# Patient Record
Sex: Female | Born: 1975
Health system: Southern US, Community
[De-identification: ages and names within clinical notes are randomized; demographics above are authoritative.]

## PROBLEM LIST (undated history)

## (undated) DIAGNOSIS — E039 Hypothyroidism, unspecified: Secondary | ICD-10-CM

## (undated) DIAGNOSIS — E785 Hyperlipidemia, unspecified: Secondary | ICD-10-CM

## (undated) DIAGNOSIS — E119 Type 2 diabetes mellitus without complications: Secondary | ICD-10-CM

---

## 2007-02-06 HISTORY — PX: CHOLECYSTECTOMY: SHX55

## 2010-12-07 HISTORY — PX: TONSILECTOMY/ADENOIDECTOMY WITH MYRINGOTOMY: SHX6125

## 2012-01-23 ENCOUNTER — Encounter: Payer: Self-pay | Admitting: Family Medicine

## 2012-01-23 ENCOUNTER — Ambulatory Visit (INDEPENDENT_AMBULATORY_CARE_PROVIDER_SITE_OTHER): Payer: Self-pay | Admitting: Family Medicine

## 2012-01-23 VITALS — BP 137/88 | HR 88 | Temp 98.6°F | Ht 67.0 in | Wt 289.0 lb

## 2012-01-23 DIAGNOSIS — E669 Obesity, unspecified: Secondary | ICD-10-CM

## 2012-01-23 DIAGNOSIS — Z0000 Encounter for general adult medical examination without abnormal findings: Secondary | ICD-10-CM

## 2012-01-23 NOTE — Assessment & Plan Note (Signed)
A: well. Normal exam. BP elevated but not hypertensive. P: Release of med record form filled out. Last pap 12/27/11 wnl. Also had STD testing and other blood work.  Once you have insurance f/u for repeat fasting cholesterol test.  F/u yearly for physicals. F/u in 3 years for pap smear.

## 2012-01-23 NOTE — Progress Notes (Signed)
Subjective:     Patient ID: Tina Boone, female   DOB: 1975/06/14, 36 y.o.   MRN: [id number]  HPI  35 yo F new patient presents for physical and to establish primary care.   1. Obesity: eat well. Water main beverage. Fruits and vegetables incorporated in most meals. Does not exercise. Children active and fit. Has a dog (Husky) that just had puppies.   History reviewed. No pertinent past medical history.  Past Surgical History  Procedure Date  . Cholecystectomy 2009    History   Social History Narrative   Lives with 60 yo daughter and 72 yo son.    Review of Systems Patient Information Form: Screening and ROS  AUDIT-C Score: 0 Do you feel safe in relationships? yes PHQ-2:negative  Review of Symptoms  General:  Negative for nexplained weight loss, fever Skin: Negative for new or changing mole, sore that won't heal HEENT: Negative for trouble hearing, trouble seeing, ringing in ears, mouth sores, hoarseness, change in voice, dysphagia. CV:  Negative for chest pain, dyspnea, edema, palpitations Resp: Negative for cough, dyspnea, hemoptysis GI: Negative for nausea, vomiting, diarrhea, constipation, abdominal pain, melena, hematochezia. GU: Negative for dysuria, incontinence, urinary hesitance, hematuria, vaginal or penile discharge, polyuria, sexual difficulty, lumps in testicle or breasts MSK: Negative for muscle cramps or aches, joint pain or swelling Neuro: Negative for headaches, weakness, numbness, dizziness, passing out/fainting Psych: Negative for depression, anxiety, memory problems    Objective:   Physical Exam BP 137/88  Pulse 88  Temp 98.6 F (37 C) (Oral)  Ht 5\' 7"  (1.702 m)  Wt 289 lb (131.09 kg)  BMI 45.26 kg/m2  LMP 12/23/2011 General appearance: alert, cooperative and no distress Head: Normocephalic, without obvious abnormality, atraumatic Eyes: conjunctivae/corneas clear. PERRL, EOM's intact.  Neck: no adenopathy, no carotid bruit, no JVD,  supple, symmetrical, trachea midline and thyroid not enlarged, symmetric, no tenderness/mass/nodules Lungs: clear to auscultation bilaterally Heart: regular rate and rhythm, S1, S2 normal, no murmur, click, rub or gallop Abdomen: soft, non-tender; bowel sounds normal; no masses,  no organomegaly and obese Extremities: extremities normal, atraumatic, no cyanosis or edema Pulses: 2+ and symmetric Neurologic: Grossly normal     Assessment and Plan:

## 2012-01-23 NOTE — Assessment & Plan Note (Signed)
Regarding weight loss, here is a basic  plan that I find helpful.   For your diet:  1. Make sure to eat breakfast, lunch and dinner (also may add one snack mid morning or mid afternoon).  2. Carbs: no more than 2 servings (30 gram/2oz) per meal and 1 serving per snack.  3. Exercise such that you sweat some and your heart rate goes up most days of the week.  4. Water, water, water   Keep in mind that there is a Artist in the office, Wyona Almas.

## 2012-01-23 NOTE — Patient Instructions (Addendum)
Ms. Fenner,   Thank you for coming in today.  It was very nice to meet you.   Regarding weight loss, here is a basic  plan that I find helpful.   For your diet:  1. Make sure to eat breakfast, lunch and dinner (also may add one snack mid morning or mid afternoon).  2. Carbs: no more than 2 servings (30 gram/2oz) per meal and 1 serving per snack.  3. Exercise such that you sweat some and your heart rate goes up most days of the week.  4. Water, water, water   Keep in mind that there is a Artist in the office, Wyona Almas.   Once you have insurance f/u for repeat fasting cholesterol test.  F/u yearly for physicals. F/u in 3 years for pap smear.   Dr. Armen Pickup

## 2013-03-06 ENCOUNTER — Encounter: Payer: Self-pay | Admitting: Physician Assistant

## 2013-03-06 ENCOUNTER — Ambulatory Visit (INDEPENDENT_AMBULATORY_CARE_PROVIDER_SITE_OTHER): Payer: 59 | Admitting: Physician Assistant

## 2013-03-06 ENCOUNTER — Ambulatory Visit: Payer: Self-pay | Admitting: Family Medicine

## 2013-03-06 VITALS — BP 145/86 | HR 82 | Wt 298.0 lb

## 2013-03-06 DIAGNOSIS — Z131 Encounter for screening for diabetes mellitus: Secondary | ICD-10-CM

## 2013-03-06 DIAGNOSIS — IMO0001 Reserved for inherently not codable concepts without codable children: Secondary | ICD-10-CM | POA: Insufficient documentation

## 2013-03-06 DIAGNOSIS — Z13220 Encounter for screening for lipoid disorders: Secondary | ICD-10-CM

## 2013-03-06 DIAGNOSIS — Z6841 Body Mass Index (BMI) 40.0 and over, adult: Secondary | ICD-10-CM

## 2013-03-06 DIAGNOSIS — Z309 Encounter for contraceptive management, unspecified: Secondary | ICD-10-CM

## 2013-03-06 DIAGNOSIS — R030 Elevated blood-pressure reading, without diagnosis of hypertension: Secondary | ICD-10-CM

## 2013-03-06 DIAGNOSIS — Z23 Encounter for immunization: Secondary | ICD-10-CM

## 2013-03-06 LAB — COMPLETE METABOLIC PANEL WITH GFR
ALBUMIN: 4.4 g/dL (ref 3.5–5.2)
ALK PHOS: 74 U/L (ref 39–117)
ALT: 19 U/L (ref 0–35)
AST: 13 U/L (ref 0–37)
BUN: 11 mg/dL (ref 6–23)
CALCIUM: 9.3 mg/dL (ref 8.4–10.5)
CO2: 25 mEq/L (ref 19–32)
CREATININE: 0.64 mg/dL (ref 0.50–1.10)
Chloride: 108 mEq/L (ref 96–112)
GFR, Est African American: >89 mL/min
GLUCOSE: 94 mg/dL (ref 70–99)
POTASSIUM: 4.8 meq/L (ref 3.5–5.3)
Sodium: 142 mEq/L (ref 135–145)
Total Bilirubin: 0.4 mg/dL (ref 0.2–1.2)
Total Protein: 7.3 g/dL (ref 6.0–8.3)

## 2013-03-06 LAB — LIPID PANEL
CHOL/HDL RATIO: 5.1 ratio
CHOLESTEROL: 174 mg/dL (ref 0–200)
HDL: 34 mg/dL — ABNORMAL LOW (ref >39)
LDL Cholesterol: 117 mg/dL — ABNORMAL HIGH (ref 0–99)
TRIGLYCERIDES: 117 mg/dL (ref <150)
VLDL: 23 mg/dL (ref 0–40)

## 2013-03-06 LAB — TSH: TSH: 6.162 u[IU]/mL — AB (ref 0.350–4.500)

## 2013-03-06 MED ORDER — MEDROXYPROGESTERONE ACETATE 150 MG/ML IM SUSP
150.0000 mg | Freq: Once | INTRAMUSCULAR | Status: AC
  Administered 2013-03-06: 150 mg via INTRAMUSCULAR

## 2013-03-06 NOTE — Progress Notes (Signed)
   Subjective:    Patient ID: Tina Boone, female    DOB: 11/07/1975, 38 y.o.   MRN: [id number]  HPI Patient is a 38 year old white female who presents to the clinic to establish care. She's having to change to a new doctor due to insurance reasons. Patient is currently on Depo-Medrol for pregnancy prevention as well as menstrual period cessation. Patient has not had her regular doctor in many years. She says it has been at least 2 years since cholesterol has been checked. She is on no current medications daily right now. She does have a history of slight blood pressure elevation. She denies any shortness of breath, chest pains, palpitations, vision changes or headaches. She does walk 30 minutes one to 2 times a week. She denies any smoking.  . Family History  Problem Relation Age of Onset  . Heart disease Father   . Stroke Brother   . Cancer Paternal Aunt     leukemia  . Cancer Paternal Grandmother [phone]    breast  . Stroke Paternal Grandfather       Review of Systems     Objective:   Physical Exam  Constitutional: She is oriented to person, place, and time. She appears well-developed and well-nourished.  Obesity.  HENT:  Head: Normocephalic and atraumatic.  Neck: Normal range of motion. Neck supple. No thyromegaly present.  Cardiovascular: Normal rate, regular rhythm and normal heart sounds.   Pulmonary/Chest: Effort normal and breath sounds normal. She has no wheezes.  Lymphadenopathy:    She has no cervical adenopathy.  Neurological: She is alert and oriented to person, place, and time.  Skin: Skin is dry.  Psychiatric: She has a normal mood and affect. Her behavior is normal.          Assessment & Plan:   obesity-discussion of help to nutrition was had today. Patient reports that she generally eats a lot of fruit and vegetables and decreases her fried foods. She has never had her thyroid checked. She admits she does not exercise daily foot at least once or twice a  week she walks for 30 minutes. She's never tried any medication to help her lose weight. Discussed with patient we continued to follow weight. I do think if she can lose weight it would decrease the possibility of future health problems. Offered nutritionist today but patient declined.  Elevated blood pressure-at this point we will continue to monitor blood pressure. Patient is obese and the like if she could lose 20 pounds her blood pressure would likely decrease as well. Patient was also counseled on low salt diet. Will followup in 2-3 months for recheck.  Contraception- depo provera given in office without complication. Screening labs were ordered today.     tdap given if office today without complication.

## 2013-03-06 NOTE — Patient Instructions (Signed)

## 2013-03-09 ENCOUNTER — Other Ambulatory Visit: Payer: Self-pay | Admitting: Physician Assistant

## 2013-03-09 MED ORDER — LEVOTHYROXINE SODIUM 50 MCG PO TABS: 50.0000 ug | ORAL_TABLET | Freq: Every day | ORAL | Status: DC

## 2013-03-22 ENCOUNTER — Encounter: Payer: Self-pay | Admitting: Emergency Medicine

## 2013-03-22 ENCOUNTER — Emergency Department (INDEPENDENT_AMBULATORY_CARE_PROVIDER_SITE_OTHER)
Admission: EM | Admit: 2013-03-22 | Discharge: 2013-03-22 | Disposition: A | Discharge status: Home / Self Care | Payer: 59 | Source: Home / Self Care | Attending: Emergency Medicine | Admitting: Emergency Medicine

## 2013-03-22 DIAGNOSIS — J029 Acute pharyngitis, unspecified: Secondary | ICD-10-CM

## 2013-03-22 DIAGNOSIS — J069 Acute upper respiratory infection, unspecified: Secondary | ICD-10-CM

## 2013-03-22 LAB — POCT RAPID STREP A (OFFICE): RAPID STREP A SCREEN: NEGATIVE

## 2013-03-22 MED ORDER — AMOXICILLIN-POT CLAVULANATE 875-125 MG PO TABS: 1.0000 | ORAL_TABLET | Freq: Two times a day (BID) | ORAL | Status: DC

## 2013-03-22 MED ORDER — GUAIFENESIN-CODEINE 100-10 MG/5ML PO SOLN: 5.0000 mL | Freq: Four times a day (QID) | ORAL | Status: DC | PRN

## 2013-03-22 NOTE — ED Provider Notes (Signed)
CSN: [id number]     Arrival date & time 03/22/13  1345 History   First MD Initiated Contact with Patient 03/22/13 1540     Chief Complaint  Patient presents with  . Fever  . Nasal Congestion  . Generalized Body Aches  . Cough     (Consider location/radiation/quality/duration/timing/severity/associated sxs/prior Treatment) HPI Tina Boone is a 38 y.o. female who complains of onset of cold symptoms for a few days, but had been sick for about 2 weeks and was getting better up until a few days ago.  The symptoms are constant and mild-moderate in severity.  Has a history of strep throat, despite having a tonsillectomy ~2 years ago. + sore throat + hoarseness + cough No pleuritic pain No wheezing + nasal congestion + post-nasal drainage + sinus pain/pressure No chest congestion No itchy/red eyes No earache No hemoptysis No SOB No chills/sweats No fever No nausea No vomiting No abdominal pain No diarrhea No skin rashes + fatigue + myalgias No headache     History reviewed. No pertinent past medical history. Past Surgical History  Procedure Laterality Date  . Cholecystectomy  2009  . Tonsilectomy/adenoidectomy with myringotomy  12/2010   Family History  Problem Relation Age of Onset  . Heart disease Father   . Stroke Brother   . Cancer Paternal Aunt     leukemia  . Cancer Paternal Grandmother [phone]    breast  . Stroke Paternal Grandfather    History  Substance Use Topics  . Smoking status: Never Smoker   . Smokeless tobacco: Never Used  . Alcohol Use: No   OB History   Grav Para Term Preterm Abortions TAB SAB Ect Mult Living                 Review of Systems  All other systems reviewed and are negative.      Allergies  Review of patient's allergies indicates no known allergies.  Home Medications   Current Outpatient Rx  Name  Route  Sig  Dispense  Refill  . amoxicillin-clavulanate (AUGMENTIN) 875-125 MG per tablet   Oral   Take 1 tablet by mouth 2  (two) times daily.   16 tablet   0   . guaiFENesin-codeine 100-10 MG/5ML syrup   Oral   Take 5 mLs by mouth every 6 (six) hours as needed for cough.   120 mL   0   . levothyroxine (SYNTHROID, LEVOTHROID) 50 MCG tablet   Oral   Take 1 tablet (50 mcg total) by mouth daily.   30 tablet   1   . medroxyPROGESTERone (DEPO-PROVERA) 150 MG/ML injection   Intramuscular   Inject 150 mg into the muscle every 3 (three) months.          BP 140/84  Pulse 109  Temp(Src) 98.1 F (36.7 C) (Oral)  Resp 18  Ht 5\' 7"  (1.702 m)  Wt 298 lb (135.172 kg)  BMI 46.66 kg/m2  SpO2 97% Physical Exam  Nursing note and vitals reviewed. Constitutional: She is oriented to person, place, and time. She appears well-developed and well-nourished.  HENT:  Head: Normocephalic and atraumatic.  Right Ear: Tympanic membrane, external ear and ear canal normal.  Left Ear: Tympanic membrane, external ear and ear canal normal.  Nose: Mucosal edema and rhinorrhea present.  Mouth/Throat: Posterior oropharyngeal erythema present. No oropharyngeal exudate or posterior oropharyngeal edema.  Eyes: No scleral icterus.  Neck: Neck supple.  Cardiovascular: Regular rhythm and normal heart sounds.   Pulmonary/Chest: Effort normal  and breath sounds normal. No respiratory distress.  Neurological: She is alert and oriented to person, place, and time.  Skin: Skin is warm and dry.  Psychiatric: She has a normal mood and affect. Her speech is normal.    ED Course  Procedures (including critical care time) Labs Review Labs Reviewed - No data to display Imaging Review No results found.    MDM   Final diagnoses:  Acute upper respiratory infections of unspecified site  Acute pharyngitis    1)  Take the prescribed antibiotic as instructed.  Rapid strep negative.  No culture done. 2)  Use nasal saline solution (over the counter) at least 3 times a day. 3)  Use over the counter decongestants like Zyrtec-D every 12  hours as needed to help with congestion.  If you have hypertension, do not take medicines with sudafed.  4)  Can take tylenol every 6 hours or motrin every 8 hours for pain or fever. 5)  Follow up with your primary doctor if no improvement in 5-7 days, sooner if increasing pain, fever, or new symptoms.     Marlaine Hind, MD 03/22/13 [phone]

## 2013-03-22 NOTE — ED Notes (Signed)
History of URI in January 2015 with intermittent improvement; 3 days ago symptoms worsened with fever, cough, congestion, hoarseness, body aches. Took cold/flu med OTC 2 hours before arrival here.

## 2013-04-01 ENCOUNTER — Ambulatory Visit (INDEPENDENT_AMBULATORY_CARE_PROVIDER_SITE_OTHER): Payer: 59 | Admitting: Physician Assistant

## 2013-04-01 ENCOUNTER — Encounter: Payer: Self-pay | Admitting: Physician Assistant

## 2013-04-01 VITALS — BP 141/89 | HR 82 | Wt 294.0 lb

## 2013-04-01 DIAGNOSIS — I10 Essential (primary) hypertension: Secondary | ICD-10-CM | POA: Insufficient documentation

## 2013-04-01 DIAGNOSIS — E039 Hypothyroidism, unspecified: Secondary | ICD-10-CM

## 2013-04-01 DIAGNOSIS — Z0000 Encounter for general adult medical examination without abnormal findings: Secondary | ICD-10-CM

## 2013-04-01 NOTE — Patient Instructions (Signed)

## 2013-04-03 DIAGNOSIS — E039 Hypothyroidism, unspecified: Secondary | ICD-10-CM | POA: Insufficient documentation

## 2013-04-03 NOTE — Progress Notes (Signed)
Subjective:     Tina Boone is a 38 y.o. female and is here for a comprehensive physical exam. The patient reports no problems.  History   Social History  . Marital Status: Single    Spouse Name: N/A    Number of Children: 2  . Years of Education: some colle   Occupational History  . [employer]      hard copies sheet music full time    Social History Main Topics  . Smoking status: Never Smoker   . Smokeless tobacco: Never Used  . Alcohol Use: No  . Drug Use: No  . Sexual Activity: Not Currently    Birth Control/ Protection: Injection     Comment: Depo   Other Topics Concern  . Not on file   Social History Narrative   Lives with 38 yo daughter and 38 yo son.    Health Maintenance  Topic Date Due  . Influenza Vaccine  09/03/2013 (Originally 09/05/2012)  . Pap Smear  12/07/2014  . Tetanus/tdap  03/07/2023    The following portions of the patient's history were reviewed and updated as appropriate: allergies, current medications, past family history, past medical history, past social history, past surgical history and problem list.  Review of Systems A comprehensive review of systems was negative.   Objective:    BP 141/89  Pulse 82  Wt 294 lb (133.358 kg) General appearance: alert, cooperative, appears stated age and morbidly obese Head: Normocephalic, without obvious abnormality, atraumatic Eyes: conjunctivae/corneas clear. PERRL, EOM's intact. Fundi benign. Ears: normal TM's and external ear canals both ears Nose: Nares normal. Septum midline. Mucosa normal. No drainage or sinus tenderness. Throat: lips, mucosa, and tongue normal; teeth and gums normal and tonsils removed. Neck: no adenopathy, no carotid bruit, no JVD, supple, symmetrical, trachea midline and thyroid not enlarged, symmetric, no tenderness/mass/nodules Back: symmetric, no curvature. ROM normal. No CVA tenderness. Lungs: clear to auscultation bilaterally Breasts: normal  appearance, no masses or tenderness Heart: regular rate and rhythm, S1, S2 normal, no murmur, click, rub or gallop Abdomen: soft, non-tender; bowel sounds normal; no masses,  no organomegaly Extremities: extremities normal, atraumatic, no cyanosis or edema Pulses: 2+ and symmetric Skin: Skin color, texture, turgor normal. No rashes or lesions Lymph nodes: Cervical, supraclavicular, and axillary nodes normal. Neurologic: Grossly normal    Assessment:    Healthy female exam.     Plan:    CPE- TDap  today. Last 12/2011. Not of age for screening mammograms. Discuss weight with patient. Patient registers in the morbid obesity category. Discuss long-term weight loss management pills such as Belviq, Contrave, qysmia. Reminded patient to we get blood pressure down she is not a candidate for phentermine. Patient does not want to start any weight loss medications at this time. Patient does not think that motivated to lose weight. Would like to see patient increase her exercise to 150 minutes a week. LDL was 117. Fasting glucose was normal.  Hypertension-patient declines medication today. I'm okay with that as long as we've recheck in 3 months and if still elevated then we discussed medication therapy. Encouraged low salt diet.  I had a brief discussion with patient about the probability of sleep apnea due to her weight and now HTN. This could be increasing her blood pressure and causing other fatigue problems. Patient is not interested in having sleep apnea study at this time. She does admit to being a mouth breather. Will continue to have this discussion with her as we  monitor her blood pressure.  Hypothyroidism-we'll check labs thyroid was elevated. Sent low-dose Synthroid 50 MCG use daily. Patient has not started taking regularly at this time. I did give patient lab slips to have checked once been on medication for at least 6 weeks. See After Visit Summary for Counseling Recommendations

## 2013-05-07 ENCOUNTER — Other Ambulatory Visit: Payer: Self-pay | Admitting: Physician Assistant

## 2013-05-25 ENCOUNTER — Encounter: Payer: Self-pay | Admitting: *Deleted

## 2013-05-25 ENCOUNTER — Ambulatory Visit (INDEPENDENT_AMBULATORY_CARE_PROVIDER_SITE_OTHER): Payer: 59 | Admitting: Physician Assistant

## 2013-05-25 VITALS — BP 133/76 | HR 82 | Ht 67.0 in | Wt 298.0 lb

## 2013-05-25 DIAGNOSIS — Z309 Encounter for contraceptive management, unspecified: Secondary | ICD-10-CM

## 2013-05-25 MED ORDER — MEDROXYPROGESTERONE ACETATE 150 MG/ML IM SUSP
150.0000 mg | Freq: Once | INTRAMUSCULAR | Status: AC
  Administered 2013-05-25: 150 mg via INTRAMUSCULAR

## 2013-05-25 NOTE — Progress Notes (Signed)
   Subjective:    Patient ID: Tina Boone, female    DOB: 02/19/1975, 38 y.o.   MRN: [id number] Depo Provera given IM today.  Left deltoid.  No complications.  Pt due for next injection July 7-20. Hutchinson Isenberg,CMA HPI    Review of Systems     Objective:   Physical Exam        Assessment & Plan:

## 2013-05-25 NOTE — Progress Notes (Signed)
   Subjective:    Patient ID: Tina Boone, female    DOB: Jul 07, 1975, 38 y.o.   MRN: [id number]  HPI    Review of Systems     Objective:   Physical Exam        Assessment & Plan:

## 2013-07-01 ENCOUNTER — Encounter: Payer: Self-pay | Admitting: Physician Assistant

## 2013-07-01 ENCOUNTER — Ambulatory Visit (INDEPENDENT_AMBULATORY_CARE_PROVIDER_SITE_OTHER): Payer: 59 | Admitting: Physician Assistant

## 2013-07-01 VITALS — BP 139/70 | HR 95 | Ht 67.0 in | Wt 297.0 lb

## 2013-07-01 DIAGNOSIS — E039 Hypothyroidism, unspecified: Secondary | ICD-10-CM

## 2013-07-01 DIAGNOSIS — Z6841 Body Mass Index (BMI) 40.0 and over, adult: Secondary | ICD-10-CM

## 2013-07-01 DIAGNOSIS — I10 Essential (primary) hypertension: Secondary | ICD-10-CM

## 2013-07-01 LAB — T4, FREE: FREE T4: 1.18 ng/dL (ref 0.80–1.80)

## 2013-07-01 LAB — TSH: TSH: 4.761 u[IU]/mL — ABNORMAL HIGH (ref 0.350–4.500)

## 2013-07-01 NOTE — Progress Notes (Signed)
   Subjective:    Patient ID: Tina Boone, female    DOB: [birth year], 38 y.o.   MRN: [id number]  HPI Pt presents to the clinic to follow up on hypothyroidism and elevated blood pressure.   Hypothyroidism-we recently started her on thyroid medication for elevated TSH. She is doing well and has no complaints. She has not noticed any tremendous difference in her mood or energy level. She comes in for recheck of labs.  Hypertension-patient has been borderline hypertensive since last physical. She has been taking her blood pressures at home and they've been running in the low 130s. She denies any chest pains, palpitations, headaches or vision changes. She admits she has not been doing anything to lose weight.   Review of Systems  All other systems reviewed and are negative.      Objective:   Physical Exam  Constitutional: She is oriented to person, place, and time. She appears well-developed and well-nourished.  HENT:  Head: Normocephalic and atraumatic.  Neck: Normal range of motion. Neck supple. No thyromegaly present.  Cardiovascular: Normal rate, regular rhythm and normal heart sounds.   Pulmonary/Chest: Effort normal and breath sounds normal. She has no wheezes.  Neurological: She is alert and oriented to person, place, and time.  Skin: Skin is dry.  Psychiatric: She has a normal mood and affect. Her behavior is normal.          Assessment & Plan:  Hypothyroidism-will recheck labs today and adjust levothyroxine accordingly.  Hypertension/obesity-blood pressure today remains borderline. Patient's blood pressure when she checks it is in the normal range. We'll continue to monitor. Right now the medication was added. We'll recheck in 6 months. Discuss with patient how was salt diet and weight loss could lower her blood pressure even more. If patient is interested in weight loss medications she could come back and discuss options.

## 2013-07-02 LAB — THYROID PEROXIDASE ANTIBODY: Thyroperoxidase Ab SerPl-aCnc: <10 IU/mL (ref <35.0)

## 2013-07-03 ENCOUNTER — Other Ambulatory Visit: Payer: Self-pay | Admitting: Physician Assistant

## 2013-07-03 ENCOUNTER — Telehealth: Payer: Self-pay

## 2013-07-03 MED ORDER — LEVOTHYROXINE SODIUM 50 MCG PO TABS: 50.0000 ug | ORAL_TABLET | Freq: Every day | ORAL | Status: DC

## 2013-07-03 NOTE — Telephone Encounter (Signed)
Will send levothyroxine for 6 month refills per lab result note.

## 2013-08-12 ENCOUNTER — Ambulatory Visit (INDEPENDENT_AMBULATORY_CARE_PROVIDER_SITE_OTHER): Payer: 59 | Admitting: Physician Assistant

## 2013-08-12 VITALS — BP 126/83 | HR 83 | Temp 98.3°F | Ht 67.0 in | Wt 297.0 lb

## 2013-08-12 DIAGNOSIS — Z3002 Counseling and instruction in natural family planning to avoid pregnancy: Secondary | ICD-10-CM

## 2013-08-12 MED ORDER — MEDROXYPROGESTERONE ACETATE 150 MG/ML IM SUSP
150.0000 mg | Freq: Once | INTRAMUSCULAR | Status: AC
  Administered 2013-08-12: 150 mg via INTRAMUSCULAR

## 2013-08-12 NOTE — Progress Notes (Signed)
   Subjective:    Patient ID: Thersa SaltChristina Snowdon, female    DOB: [birth year], 38 y.o.   MRN: [id number]  HPI    Review of Systems     Objective:   Physical Exam        Assessment & Plan:  Pt tolerated well. Injection given in right deltoid. Pt denies any abnormal bleeding, no headaches, no chest pain, no abdominal pain. Pt states she is feeling good,/ Dyke MaesStacy Ellise Kovack, CMA

## 2013-10-20 ENCOUNTER — Other Ambulatory Visit: Payer: Self-pay | Admitting: Physician Assistant

## 2013-11-12 ENCOUNTER — Ambulatory Visit (INDEPENDENT_AMBULATORY_CARE_PROVIDER_SITE_OTHER): Payer: 59 | Admitting: Physician Assistant

## 2013-11-12 VITALS — BP 139/91 | HR 83 | Ht 67.0 in | Wt 300.0 lb

## 2013-11-12 DIAGNOSIS — Z308 Encounter for other contraceptive management: Secondary | ICD-10-CM

## 2013-11-12 MED ORDER — MEDROXYPROGESTERONE ACETATE 150 MG/ML IM SUSP: 150.0000 mg | Freq: Once | INTRAMUSCULAR | Status: DC

## 2013-11-12 MED ORDER — MEDROXYPROGESTERONE ACETATE 150 MG/ML IM SUSP
150.0000 mg | Freq: Once | INTRAMUSCULAR | Status: AC
  Administered 2013-11-12: 150 mg via INTRAMUSCULAR

## 2013-11-12 NOTE — Progress Notes (Signed)
   Subjective:    Patient ID: Thersa SaltChristina Leth, female    DOB: 1975/12/13, 38 y.o.   MRN: [id number]  HPI Trula OreChristina reported to office today for depo shot injection. No complaints at this time. Corliss SkainsJamie Phelan Schadt, CMA    Review of Systems     Objective:   Physical Exam        Assessment & Plan:   Reminded to RTC between December 24-Feb 08, 2014 for next injection.

## 2014-01-04 ENCOUNTER — Ambulatory Visit (INDEPENDENT_AMBULATORY_CARE_PROVIDER_SITE_OTHER): Payer: 59 | Admitting: Physician Assistant

## 2014-01-04 ENCOUNTER — Encounter: Payer: Self-pay | Admitting: Physician Assistant

## 2014-01-04 VITALS — BP 142/81 | HR 86 | Ht 67.0 in | Wt 300.0 lb

## 2014-01-04 DIAGNOSIS — Z23 Encounter for immunization: Secondary | ICD-10-CM

## 2014-01-04 DIAGNOSIS — IMO0001 Reserved for inherently not codable concepts without codable children: Secondary | ICD-10-CM

## 2014-01-04 DIAGNOSIS — E038 Other specified hypothyroidism: Secondary | ICD-10-CM

## 2014-01-04 DIAGNOSIS — R030 Elevated blood-pressure reading, without diagnosis of hypertension: Secondary | ICD-10-CM

## 2014-01-04 NOTE — Progress Notes (Signed)
   Subjective:    Patient ID: Tina Boone, female    DOB: February 08, 1975, 38 y.o.   MRN: [id number]  HPI Pt presents to the clinic for medication refills.  Hypothyroidism- no change was made to synthroid at last visit but TSH was up. Needs recheck. Feels great. No complaints or concerns.   Elevated BP- no hx of BP problems. No CP, palpitations, SOB, headache, or vision changes.   Review of Systems  All other systems reviewed and are negative.      Objective:   Physical Exam  Constitutional: She is oriented to person, place, and time. She appears well-developed and well-nourished.  HENT:  Head: Normocephalic and atraumatic.  Neck: Normal range of motion. Neck supple. No thyromegaly present.  Cardiovascular: Normal rate, regular rhythm and normal heart sounds.   Pulmonary/Chest: Effort normal and breath sounds normal. She has no wheezes.  Neurological: She is alert and oriented to person, place, and time.  Skin: Skin is dry.  Psychiatric: She has a normal mood and affect. Her behavior is normal.          Assessment & Plan:  Hypothyroidism- will recheck TSH and free t4. Will adjust accordingly.   Elevated blood pressure- discussed DASH diet and weight loss. Will recheck in 6 months.   Flu shot given without complications.

## 2014-01-04 NOTE — Patient Instructions (Signed)
DASH Eating Plan °DASH stands for "Dietary Approaches to Stop Hypertension." The DASH eating plan is a healthy eating plan that has been shown to reduce high blood pressure (hypertension). Additional health benefits may include reducing the risk of type 2 diabetes mellitus, heart disease, and stroke. The DASH eating plan may also help with weight loss. °WHAT DO I NEED TO KNOW ABOUT THE DASH EATING PLAN? °For the DASH eating plan, you will follow these general guidelines: °· Choose foods with a percent daily value for sodium of less than 5% (as listed on the food label). °· Use salt-free seasonings or herbs instead of table salt or sea salt. °· Check with your health care provider or pharmacist before using salt substitutes. °· Eat lower-sodium products, often labeled as "lower sodium" or "no salt added." °· Eat fresh foods. °· Eat more vegetables, fruits, and low-fat dairy products. °· Choose whole grains. Look for the word "whole" as the first word in the ingredient list. °· Choose fish and skinless chicken or turkey more often than red meat. Limit fish, poultry, and meat to 6 oz (170 g) each day. °· Limit sweets, desserts, sugars, and sugary drinks. °· Choose heart-healthy fats. °· Limit cheese to 1 oz (28 g) per day. °· Eat more home-cooked food and less restaurant, buffet, and fast food. °· Limit fried foods. °· Cook foods using methods other than frying. °· Limit canned vegetables. If you do use them, rinse them well to decrease the sodium. °· When eating at a restaurant, ask that your food be prepared with less salt, or no salt if possible. °WHAT FOODS CAN I EAT? °Seek help from a dietitian for individual calorie needs. °Grains °Whole grain or whole wheat bread. Brown rice. Whole grain or whole wheat pasta. Quinoa, bulgur, and whole grain cereals. Low-sodium cereals. Corn or whole wheat flour tortillas. Whole grain cornbread. Whole grain crackers. Low-sodium crackers. °Vegetables °Fresh or frozen vegetables  (raw, steamed, roasted, or grilled). Low-sodium or reduced-sodium tomato and vegetable juices. Low-sodium or reduced-sodium tomato sauce and paste. Low-sodium or reduced-sodium canned vegetables.  °Fruits °All fresh, canned (in natural juice), or frozen fruits. °Meat and Other Protein Products °Ground beef (85% or leaner), grass-fed beef, or beef trimmed of fat. Skinless chicken or turkey. Ground chicken or turkey. Pork trimmed of fat. All fish and seafood. Eggs. Dried beans, peas, or lentils. Unsalted nuts and seeds. Unsalted canned beans. °Dairy °Low-fat dairy products, such as skim or 1% milk, 2% or reduced-fat cheeses, low-fat ricotta or cottage cheese, or plain low-fat yogurt. Low-sodium or reduced-sodium cheeses. °Fats and Oils °Tub margarines without trans fats. Light or reduced-fat mayonnaise and salad dressings (reduced sodium). Avocado. Safflower, olive, or canola oils. Natural peanut or almond butter. °Other °Unsalted popcorn and pretzels. °The items listed above may not be a complete list of recommended foods or beverages. Contact your dietitian for more options. °WHAT FOODS ARE NOT RECOMMENDED? °Grains °White bread. White pasta. White rice. Refined cornbread. Bagels and croissants. Crackers that contain trans fat. °Vegetables °Creamed or fried vegetables. Vegetables in a cheese sauce. Regular canned vegetables. Regular canned tomato sauce and paste. Regular tomato and vegetable juices. °Fruits °Dried fruits. Canned fruit in light or heavy syrup. Fruit juice. °Meat and Other Protein Products °Fatty cuts of meat. Ribs, chicken wings, bacon, sausage, bologna, salami, chitterlings, fatback, hot dogs, bratwurst, and packaged luncheon meats. Salted nuts and seeds. Canned beans with salt. °Dairy °Whole or 2% milk, cream, half-and-half, and cream cheese. Whole-fat or sweetened yogurt. Full-fat   cheeses or blue cheese. Nondairy creamers and whipped toppings. Processed cheese, cheese spreads, or cheese  curds. °Condiments °Onion and garlic salt, seasoned salt, table salt, and sea salt. Canned and packaged gravies. Worcestershire sauce. Tartar sauce. Barbecue sauce. Teriyaki sauce. Soy sauce, including reduced sodium. Steak sauce. Fish sauce. Oyster sauce. Cocktail sauce. Horseradish. Ketchup and mustard. Meat flavorings and tenderizers. Bouillon cubes. Hot sauce. Tabasco sauce. Marinades. Taco seasonings. Relishes. °Fats and Oils °Butter, stick margarine, lard, shortening, ghee, and bacon fat. Coconut, palm kernel, or palm oils. Regular salad dressings. °Other °Pickles and olives. Salted popcorn and pretzels. °The items listed above may not be a complete list of foods and beverages to avoid. Contact your dietitian for more information. °WHERE CAN I FIND MORE INFORMATION? °National Heart, Lung, and [employer] Institute: www.nhlbi.nih.gov/health/health-topics/topics/dash/ °Document Released: 01/11/2011 Document Revised: 06/08/2013 Document Reviewed: 11/26/2012 °ExitCare® Patient Information ©2015 [employer]. This information is not intended to replace advice given to you by your health care provider. Make sure you discuss any questions you have with your health care provider. ° °

## 2014-01-05 LAB — T4, FREE: Free T4: 1.1 ng/dL (ref 0.80–1.80)

## 2014-01-05 LAB — TSH: TSH: 6.028 u[IU]/mL — ABNORMAL HIGH (ref 0.350–4.500)

## 2014-02-01 ENCOUNTER — Ambulatory Visit (INDEPENDENT_AMBULATORY_CARE_PROVIDER_SITE_OTHER): Payer: 59 | Admitting: Physician Assistant

## 2014-02-01 VITALS — BP 133/83 | HR 83 | Wt 305.0 lb

## 2014-02-01 DIAGNOSIS — Z3042 Encounter for surveillance of injectable contraceptive: Secondary | ICD-10-CM

## 2014-02-01 MED ORDER — MEDROXYPROGESTERONE ACETATE 150 MG/ML IM SUSP
150.0000 mg | Freq: Once | INTRAMUSCULAR | Status: AC
  Administered 2014-02-01: 150 mg via INTRAMUSCULAR

## 2014-02-01 NOTE — Progress Notes (Signed)
   Subjective:    Patient ID: Tina Boone, female    DOB: Feb 21, 1975, 38 y.o.   MRN: [id number]  HPI  Tina OreChristina is here for her 3 month Depo-Provera injection. Denies any problems with the medication.   Review of Systems     Objective:   Physical Exam        Assessment & Plan:  Patient tolerated injection well without complications. Patient advised to schedule next injection 90 days from today. Next injection should be given between March 15 th - March 29 th.

## 2014-03-18 ENCOUNTER — Other Ambulatory Visit: Payer: Self-pay | Admitting: Physician Assistant

## 2014-03-18 NOTE — Telephone Encounter (Signed)
Due for f/u labs in may

## 2014-04-20 ENCOUNTER — Ambulatory Visit (INDEPENDENT_AMBULATORY_CARE_PROVIDER_SITE_OTHER): Payer: 59 | Admitting: Physician Assistant

## 2014-04-20 VITALS — BP 144/82 | HR 78 | Wt 305.0 lb

## 2014-04-20 DIAGNOSIS — Z3042 Encounter for surveillance of injectable contraceptive: Secondary | ICD-10-CM

## 2014-04-20 MED ORDER — MEDROXYPROGESTERONE ACETATE 150 MG/ML IM SUSP
150.0000 mg | Freq: Once | INTRAMUSCULAR | Status: AC
  Administered 2014-04-20: 150 mg via INTRAMUSCULAR

## 2014-04-20 NOTE — Progress Notes (Signed)
Pt here for depo injection. Injection given in LD and tolerated well. Pt to RTC between 5/31-6/14/2016 for next injection.Tina PacasBarkley, Tequlia Gonsalves TampicoLynetta

## 2014-04-21 ENCOUNTER — Encounter: Payer: Self-pay | Admitting: Physician Assistant

## 2014-07-07 ENCOUNTER — Ambulatory Visit: Payer: 59

## 2014-07-07 ENCOUNTER — Ambulatory Visit: Payer: 59 | Admitting: Physician Assistant

## 2014-07-09 ENCOUNTER — Encounter: Payer: Self-pay | Admitting: Family Medicine

## 2014-07-09 ENCOUNTER — Ambulatory Visit (INDEPENDENT_AMBULATORY_CARE_PROVIDER_SITE_OTHER): Payer: 59 | Admitting: Family Medicine

## 2014-07-09 VITALS — BP 134/81 | HR 91 | Ht 67.0 in | Wt 304.0 lb

## 2014-07-09 DIAGNOSIS — R030 Elevated blood-pressure reading, without diagnosis of hypertension: Secondary | ICD-10-CM

## 2014-07-09 DIAGNOSIS — Z3042 Encounter for surveillance of injectable contraceptive: Secondary | ICD-10-CM

## 2014-07-09 DIAGNOSIS — IMO0001 Reserved for inherently not codable concepts without codable children: Secondary | ICD-10-CM

## 2014-07-09 DIAGNOSIS — E038 Other specified hypothyroidism: Secondary | ICD-10-CM | POA: Diagnosis not present

## 2014-07-09 DIAGNOSIS — R635 Abnormal weight gain: Secondary | ICD-10-CM

## 2014-07-09 LAB — COMPLETE METABOLIC PANEL WITH GFR
ALK PHOS: 75 U/L (ref 39–117)
ALT: 21 U/L (ref 0–35)
AST: 17 U/L (ref 0–37)
Albumin: 4 g/dL (ref 3.5–5.2)
BILIRUBIN TOTAL: 0.7 mg/dL (ref 0.2–1.2)
BUN: 11 mg/dL (ref 6–23)
CHLORIDE: 106 meq/L (ref 96–112)
CO2: 24 mEq/L (ref 19–32)
Calcium: 9.3 mg/dL (ref 8.4–10.5)
Creat: 0.58 mg/dL (ref 0.50–1.10)
GFR, Est African American: >89 mL/min
GLUCOSE: 152 mg/dL — AB (ref 70–99)
POTASSIUM: 4.5 meq/L (ref 3.5–5.3)
Sodium: 143 mEq/L (ref 135–145)
Total Protein: 7.1 g/dL (ref 6.0–8.3)

## 2014-07-09 LAB — LIPID PANEL
Cholesterol: 176 mg/dL (ref 0–200)
HDL: 30 mg/dL — AB (ref >=46)
LDL Cholesterol: 109 mg/dL — ABNORMAL HIGH (ref 0–99)
Total CHOL/HDL Ratio: 5.9 Ratio
Triglycerides: 187 mg/dL — ABNORMAL HIGH (ref <150)
VLDL: 37 mg/dL (ref 0–40)

## 2014-07-09 LAB — TSH: TSH: 4.873 u[IU]/mL — ABNORMAL HIGH (ref 0.350–4.500)

## 2014-07-09 MED ORDER — MEDROXYPROGESTERONE ACETATE 150 MG/ML IM SUSP
150.0000 mg | Freq: Once | INTRAMUSCULAR | Status: AC
  Administered 2014-07-09: 150 mg via INTRAMUSCULAR

## 2014-07-09 MED ORDER — PHENTERMINE HCL 37.5 MG PO CAPS: 37.5000 mg | ORAL_CAPSULE | ORAL | Status: DC

## 2014-07-09 NOTE — Progress Notes (Signed)
Pt given Depo inj in R deltoid tolerated well. She can RTC for next inj August 19- October 08, 2014.Loralee PacasBarkley, Japji Kok PurdyLynetta

## 2014-07-09 NOTE — Patient Instructions (Signed)
Recommend My Fitness Pal to help you set calorie goals.  Scheduled exercise 5 days per week.

## 2014-07-09 NOTE — Progress Notes (Signed)
   Subjective:    Patient ID: Tina Boone, female    DOB: Dec 13, 1975, 39 y.o.   MRN: [id number]  HPI Hypothyroidism-last TSH was 6 months ago. No adjustments were made to the TSH was mildly elevated around 6. No recent skin or hair changes or weight changes.  Contraception counseling-[place] up her very injection. Next  Also follow-up for her blood pressure. Was elevated at her visit in November. Her provider had discussed the DASH diet and some weight loss to help lower blood pressure.   BMI 47- She is in weight loss medications. She said that she had mentioned this to her in the past and that certainly she would be open to discussing the options in the future. She says she really has made some major dietary changes over the last year which she thinks is good for her and her family. She really doesn't eat much breakfast though occasionally just has a cereal bar. Sounds like she eats a pretty well-balanced dinner and eats a very small lunch.  Review of Systems     Objective:   Physical Exam  Constitutional: She is oriented to person, place, and time. She appears well-developed and well-nourished.  HENT:  Head: Normocephalic and atraumatic.  Cardiovascular: Normal rate, regular rhythm and normal heart sounds.   Pulmonary/Chest: Effort normal and breath sounds normal.  Neurological: She is alert and oriented to person, place, and time.  Skin: Skin is warm and dry.  Psychiatric: She has a normal mood and affect. Her behavior is normal.          Assessment & Plan:  Hypothyroidism- due to recheck TSH.  Will adjust if needed. She is currently asymptomatic.  Contraception- due for depo shot.    Abnormal weight gain/BMI 47-we discussed different options including stimulant and non-stimulant perception medications. We will start with phentermine mostly because of cost effectiveness. She has not any prior cardiac problems. She has had borderline blood pressures though it looks great  today. Spine her we will need to keep a close eye on this and if it is her blood pressure she will not be able to take this medication. She will need a follow-up monthly for nurse blood pressure and weight checks in every 3 months we'll need to see the provider. Also discussed the importance of being on a diet and exercise program with the medication to be successful. I encouraged her to work at least 5 days per week. And also encouraged her to use some type of dietary program such as the smart phone application like my fitness PAL which can help her set goals and track calories.  Time spent 25 minutes, greater than 57 time spent counseling about need for weight loss, abnormal weight gain, BMI 47, and hypothyroidism.

## 2014-07-12 ENCOUNTER — Other Ambulatory Visit: Payer: Self-pay | Admitting: Family Medicine

## 2014-07-12 MED ORDER — LEVOTHYROXINE SODIUM 75 MCG PO TABS: 75.0000 ug | ORAL_TABLET | Freq: Every day | ORAL | Status: DC

## 2014-07-15 ENCOUNTER — Other Ambulatory Visit: Payer: Self-pay | Admitting: Physician Assistant

## 2014-08-06 ENCOUNTER — Ambulatory Visit (INDEPENDENT_AMBULATORY_CARE_PROVIDER_SITE_OTHER): Payer: 59 | Admitting: Family Medicine

## 2014-08-06 VITALS — BP 121/76 | HR 81 | Ht 67.0 in | Wt 287.0 lb

## 2014-08-06 DIAGNOSIS — R635 Abnormal weight gain: Secondary | ICD-10-CM | POA: Diagnosis not present

## 2014-08-06 MED ORDER — PHENTERMINE HCL 37.5 MG PO CAPS: 37.5000 mg | ORAL_CAPSULE | ORAL | Status: DC

## 2014-08-06 NOTE — Progress Notes (Signed)
   Subjective:    Patient ID: Tina Boone, female    DOB: 06/21/1975, 39 y.o.   MRN: [id number]  HPI Patient is here for a blood pressure and weight check. Denies trouble sleeping, palpitations or medication problems.   Review of Systems     Objective:   Physical Exam        Assessment & Plan:  Patient has lost weight. A refill of phentermine will be faxed to pharmacy. Patient advised to schedule a follow up with nurse in 30 days.

## 2014-08-06 NOTE — Progress Notes (Signed)
Successful weight loss, refill provided 

## 2014-08-26 ENCOUNTER — Other Ambulatory Visit: Payer: Self-pay | Admitting: Physician Assistant

## 2014-08-26 MED ORDER — LEVOTHYROXINE SODIUM 75 MCG PO TABS: 75.0000 ug | ORAL_TABLET | Freq: Every day | ORAL | Status: DC

## 2014-09-10 ENCOUNTER — Ambulatory Visit (INDEPENDENT_AMBULATORY_CARE_PROVIDER_SITE_OTHER): Payer: 59 | Admitting: Family Medicine

## 2014-09-10 VITALS — BP 136/89 | HR 89 | Wt 279.0 lb

## 2014-09-10 DIAGNOSIS — R635 Abnormal weight gain: Secondary | ICD-10-CM

## 2014-09-10 MED ORDER — PHENTERMINE HCL 37.5 MG PO CAPS: 37.5000 mg | ORAL_CAPSULE | ORAL | Status: DC

## 2014-09-10 NOTE — Progress Notes (Signed)
Agree with nurse note.

## 2014-09-10 NOTE — Progress Notes (Signed)
   Subjective:    Patient ID: Tina Boone, female    DOB: October 31, 1975, 39 y.o.   MRN: [id number]  HPI  Patient is here for blood pressure and weight check. Denies any trouble sleeping, palpitations, or any other medication problems. Pt states she has been changing her diet and has worked really hard to cut diary out of her diet.   Review of Systems     Objective:   Physical Exam        Assessment & Plan:  Patient has lost weight. A refill for Phentermine will be sent to patient preferred pharmacy (request her Phentermine Rx go to [facility], but all other Rx's remain at [facility]). Patient advised to schedule a four week nurse visit and keep her upcoming appointment with her PCP. Verbalized understanding, no further questions.

## 2014-09-24 ENCOUNTER — Ambulatory Visit (INDEPENDENT_AMBULATORY_CARE_PROVIDER_SITE_OTHER): Payer: 59 | Admitting: Family Medicine

## 2014-09-24 VITALS — BP 119/73 | HR 85

## 2014-09-24 DIAGNOSIS — Z3042 Encounter for surveillance of injectable contraceptive: Secondary | ICD-10-CM | POA: Diagnosis not present

## 2014-09-24 MED ORDER — MEDROXYPROGESTERONE ACETATE 150 MG/ML IM SUSP
150.0000 mg | Freq: Once | INTRAMUSCULAR | Status: AC
  Administered 2014-09-24: 150 mg via INTRAMUSCULAR

## 2014-09-24 NOTE — Progress Notes (Signed)
   Subjective:    Patient ID: Tina Boone, female    DOB: 29-Nov-1975, 39 y.o.   MRN: [id number]  HPI    Review of Systems     Objective:   Physical Exam        Assessment & Plan:  Agree with below.

## 2014-09-24 NOTE — Progress Notes (Signed)
   Subjective:    Patient ID: Tina Boone, female    DOB: Feb 20, 1975, 39 y.o.   MRN: [id number]  HPI  Tina Boone is here for Depo-Provera injection.   Review of Systems     Objective:   Physical Exam        Assessment & Plan:  Patient tolerated injection well without complications. Patient advised to schedule next injection November 4 - November 18.

## 2014-10-08 ENCOUNTER — Ambulatory Visit (INDEPENDENT_AMBULATORY_CARE_PROVIDER_SITE_OTHER): Payer: 59 | Admitting: Physician Assistant

## 2014-10-08 ENCOUNTER — Other Ambulatory Visit: Payer: Self-pay | Admitting: Family Medicine

## 2014-10-08 VITALS — BP 120/75 | HR 89 | Ht 67.0 in | Wt 271.0 lb

## 2014-10-08 DIAGNOSIS — R635 Abnormal weight gain: Secondary | ICD-10-CM | POA: Diagnosis not present

## 2014-10-08 MED ORDER — PHENTERMINE HCL 37.5 MG PO CAPS: 37.5000 mg | ORAL_CAPSULE | ORAL | Status: DC

## 2014-10-08 NOTE — Progress Notes (Signed)
   Subjective:    Patient ID: Tina Boone, female    DOB: 29-Aug-1975, 39 y.o.   MRN: [id number]  HPI  Tina Boone is her for a weight and blood pressure check. Denies palpitations or trouble sleeping. Down 30lbs total since starting phentermine.   Review of Systems     Objective:   Physical Exam        Assessment & Plan:  Patient has lost weight. A refill of phentermine will be sent to [facility]. Follow up in 1 month.

## 2014-11-12 ENCOUNTER — Ambulatory Visit (INDEPENDENT_AMBULATORY_CARE_PROVIDER_SITE_OTHER): Payer: 59 | Admitting: Sports Medicine

## 2014-11-12 VITALS — BP 121/84 | HR 83 | Wt 260.0 lb

## 2014-11-12 DIAGNOSIS — R635 Abnormal weight gain: Secondary | ICD-10-CM | POA: Diagnosis not present

## 2014-11-12 MED ORDER — PHENTERMINE HCL 37.5 MG PO CAPS: 37.5000 mg | ORAL_CAPSULE | ORAL | Status: DC

## 2014-11-12 NOTE — Progress Notes (Signed)
   Subjective:    Patient ID: Tina Boone, female    DOB: June 04, 1975, 39 y.o.   MRN: [id number]  HPI  Tina Boone is here for BP and weight check.  Denies any medication problems, chest pain, and trouble sleeping.  Review of Systems     Objective:   Physical Exam        Assessment & Plan:   Tina Boone has lost weight.  On 10/08/14 her weight was 271 lb and today's weight is 260 lb. Pt advised to make next appointment in 30 days.

## 2014-11-25 ENCOUNTER — Other Ambulatory Visit: Payer: Self-pay | Admitting: Physician Assistant

## 2014-12-10 ENCOUNTER — Ambulatory Visit (INDEPENDENT_AMBULATORY_CARE_PROVIDER_SITE_OTHER): Payer: 59 | Admitting: Physician Assistant

## 2014-12-10 VITALS — BP 133/87 | HR 84 | Wt 253.0 lb

## 2014-12-10 DIAGNOSIS — Z3042 Encounter for surveillance of injectable contraceptive: Secondary | ICD-10-CM | POA: Diagnosis not present

## 2014-12-10 DIAGNOSIS — R635 Abnormal weight gain: Secondary | ICD-10-CM | POA: Diagnosis not present

## 2014-12-10 MED ORDER — MEDROXYPROGESTERONE ACETATE 150 MG/ML IM SUSP
150.0000 mg | Freq: Once | INTRAMUSCULAR | Status: AC
  Administered 2014-12-10: 150 mg via INTRAMUSCULAR

## 2014-12-10 MED ORDER — MEDROXYPROGESTERONE ACETATE 150 MG/ML IM SUSP: 150.0000 mg | Freq: Once | INTRAMUSCULAR | Status: DC

## 2014-12-10 MED ORDER — PHENTERMINE HCL 37.5 MG PO CAPS: 37.5000 mg | ORAL_CAPSULE | ORAL | Status: DC

## 2014-12-10 NOTE — Progress Notes (Signed)
   Subjective:    Patient ID: Tina Boone, female    DOB: 10-Aug-1975, 39 y.o.   MRN: [id number]  HPI  Tina Boone is here for blood pressure, weight check and Depo Provera injection. Denies any medication problems.   Review of Systems     Objective:   Physical Exam        Assessment & Plan:  Abnormal weight gain - Patient has lost weight. [facility].   Contraception Management - Patient tolerated injection well without complications. Patient advised to return between January 20 th through February 3 rd for next Depo Provera injection.

## 2015-01-07 ENCOUNTER — Ambulatory Visit (INDEPENDENT_AMBULATORY_CARE_PROVIDER_SITE_OTHER): Payer: 59 | Admitting: Physician Assistant

## 2015-01-07 VITALS — BP 133/80 | HR 103 | Wt 246.0 lb

## 2015-01-07 DIAGNOSIS — R635 Abnormal weight gain: Secondary | ICD-10-CM

## 2015-01-07 DIAGNOSIS — E038 Other specified hypothyroidism: Secondary | ICD-10-CM

## 2015-01-07 LAB — TSH: TSH: 0.482 u[IU]/mL (ref 0.350–4.500)

## 2015-01-07 MED ORDER — PHENTERMINE HCL 37.5 MG PO CAPS: 37.5000 mg | ORAL_CAPSULE | ORAL | Status: DC

## 2015-01-07 NOTE — Progress Notes (Signed)
   Subjective:    Patient ID: Tina Boone, female    DOB: [birth year], 39 y.o.   MRN: [id number]  HPI  Tina Boone is here for blood pressure and weight check. Diet and exercise is going well. Denies trouble sleeping or palpitations.   Hypothyroid - Patient is due for follow up TSH.   Review of Systems     Objective:   Physical Exam        Assessment & Plan:  Abnormal weight gain - Patient has lost weight. A [facility].   Hypothyroid - TSH pending.

## 2015-01-13 MED ORDER — LEVOTHYROXINE SODIUM 75 MCG PO TABS: 75.0000 ug | ORAL_TABLET | Freq: Every day | ORAL | Status: DC

## 2015-01-13 NOTE — Addendum Note (Signed)
Addended by: Juel BurrowBENDER, Makinzee Durley L on: 01/13/2015 04:32 PM   Modules accepted: Orders

## 2015-02-11 ENCOUNTER — Ambulatory Visit (INDEPENDENT_AMBULATORY_CARE_PROVIDER_SITE_OTHER): Payer: BLUE CROSS/BLUE SHIELD | Admitting: Physician Assistant

## 2015-02-11 VITALS — BP 117/69 | HR 99 | Wt 241.0 lb

## 2015-02-11 DIAGNOSIS — Z23 Encounter for immunization: Secondary | ICD-10-CM | POA: Diagnosis not present

## 2015-02-11 DIAGNOSIS — R635 Abnormal weight gain: Secondary | ICD-10-CM | POA: Diagnosis not present

## 2015-02-11 MED ORDER — PHENTERMINE HCL 37.5 MG PO CAPS: 37.5000 mg | ORAL_CAPSULE | ORAL | Status: DC

## 2015-02-11 NOTE — Progress Notes (Signed)
   Subjective:    Patient ID: Tina Boone, female    DOB: April 15, 1975, 40 y.o.   MRN: [id number]  HPI  Tina Boone is here for blood pressure and weight check. Diet and exercise is going well. Denies trouble sleeping or palpitations.   Flu vaccine  Review of Systems     Objective:   Physical Exam        Assessment & Plan:  Abnormal weight gain - Patient has lost weight. A refill of phentermine [facility] be [facility].   Flu vaccine - Patient tolerated injection well without complications.

## 2015-02-25 ENCOUNTER — Ambulatory Visit (INDEPENDENT_AMBULATORY_CARE_PROVIDER_SITE_OTHER): Payer: BLUE CROSS/BLUE SHIELD | Admitting: Physician Assistant

## 2015-02-25 VITALS — BP 121/75 | HR 100 | Wt 237.0 lb

## 2015-02-25 DIAGNOSIS — Z3042 Encounter for surveillance of injectable contraceptive: Secondary | ICD-10-CM

## 2015-02-25 MED ORDER — MEDROXYPROGESTERONE ACETATE 150 MG/ML IM SUSP: 150.0000 mg | Freq: Once | INTRAMUSCULAR | Status: DC

## 2015-02-25 MED ORDER — MEDROXYPROGESTERONE ACETATE 150 MG/ML IM SUSP
150.0000 mg | Freq: Once | INTRAMUSCULAR | Status: AC
  Administered 2015-02-25: 150 mg via INTRAMUSCULAR

## 2015-02-25 NOTE — Progress Notes (Signed)
   Subjective:    Patient ID: Tina Boone, female    DOB: [birth year], 40 y.o.   MRN: [id number]  HPI  Tina Boone is here for a Depo-Provera injection. Denies any problems with medication.   Review of Systems     Objective:   Physical Exam        Assessment & Plan:  Contraceptive Management - Patient tolerated injection well without complications. Patient advised to schedule next injection between April 7 th through April 21 st.

## 2015-03-11 ENCOUNTER — Ambulatory Visit (INDEPENDENT_AMBULATORY_CARE_PROVIDER_SITE_OTHER): Payer: BLUE CROSS/BLUE SHIELD | Admitting: Physician Assistant

## 2015-03-11 ENCOUNTER — Other Ambulatory Visit (HOSPITAL_COMMUNITY)
Admission: RE | Admit: 2015-03-11 | Discharge: 2015-03-11 | Disposition: A | Discharge status: Home / Self Care | Payer: BLUE CROSS/BLUE SHIELD | Source: Ambulatory Visit | Attending: Physician Assistant | Admitting: Physician Assistant

## 2015-03-11 ENCOUNTER — Encounter: Payer: Self-pay | Admitting: Physician Assistant

## 2015-03-11 VITALS — BP 142/69 | HR 100 | Ht 67.0 in | Wt 232.0 lb

## 2015-03-11 DIAGNOSIS — R635 Abnormal weight gain: Secondary | ICD-10-CM | POA: Diagnosis not present

## 2015-03-11 DIAGNOSIS — N760 Acute vaginitis: Secondary | ICD-10-CM | POA: Diagnosis present

## 2015-03-11 DIAGNOSIS — Z113 Encounter for screening for infections with a predominantly sexual mode of transmission: Secondary | ICD-10-CM | POA: Diagnosis present

## 2015-03-11 DIAGNOSIS — Z0000 Encounter for general adult medical examination without abnormal findings: Secondary | ICD-10-CM

## 2015-03-11 DIAGNOSIS — IMO0001 Reserved for inherently not codable concepts without codable children: Secondary | ICD-10-CM

## 2015-03-11 DIAGNOSIS — Z01419 Encounter for gynecological examination (general) (routine) without abnormal findings: Secondary | ICD-10-CM | POA: Diagnosis not present

## 2015-03-11 DIAGNOSIS — Z1151 Encounter for screening for human papillomavirus (HPV): Secondary | ICD-10-CM | POA: Insufficient documentation

## 2015-03-11 DIAGNOSIS — E039 Hypothyroidism, unspecified: Secondary | ICD-10-CM

## 2015-03-11 DIAGNOSIS — R030 Elevated blood-pressure reading, without diagnosis of hypertension: Secondary | ICD-10-CM

## 2015-03-11 DIAGNOSIS — Z6841 Body Mass Index (BMI) 40.0 and over, adult: Secondary | ICD-10-CM

## 2015-03-11 MED ORDER — PHENTERMINE HCL 37.5 MG PO CAPS: 37.5000 mg | ORAL_CAPSULE | ORAL | Status: DC

## 2015-03-11 NOTE — Patient Instructions (Signed)

## 2015-03-13 NOTE — Progress Notes (Signed)
  Subjective:     Tina Boone is a 40 y.o. female and is here for a comprehensive physical exam. The patient reports no problems.lots 70lbs over past 6 months. Doing great. No problems or concerns. Needs refill on phentermine.   Social History   Social History  . Marital Status: Single    Spouse Name: N/A  . Number of Children: 2  . Years of Education: some colle   Occupational History  . [employer]      hard copies sheet music full time    Social History Main Topics  . Smoking status: Never Smoker   . Smokeless tobacco: Never Used  . Alcohol Use: No  . Drug Use: No  . Sexual Activity: Not Currently    Birth Control/ Protection: Injection     Comment: Depo   Other Topics Concern  . Not on file   Social History Narrative   Lives with 32 yo daughter and 40 yo son.    Health Maintenance  Topic Date Due  . HIV Screening  07/05/1990  . PAP SMEAR  12/27/2014  . INFLUENZA VACCINE  09/06/2015  . TETANUS/TDAP  03/07/2023    The following portions of the patient's history were reviewed and updated as appropriate: allergies, current medications, past family history, past medical history, past social history, past surgical history and problem list.  Review of Systems Pertinent items noted in HPI and remainder of comprehensive ROS otherwise negative.   Objective:    BP 142/69 mmHg  Pulse 100  Ht  (1.702 m)  Wt 232 lb (105.235 kg)  BMI 36.33 kg/m2 General appearance: alert, cooperative, appears stated age and morbidly obese Head: Normocephalic, without obvious abnormality, atraumatic Eyes: conjunctivae/corneas clear. PERRL, EOM's intact. Fundi benign. Ears: normal TM's and external ear canals both ears Nose: Nares normal. Septum midline. Mucosa normal. No drainage or sinus tenderness. Throat: lips, mucosa, and tongue normal; teeth and gums normal Neck: no adenopathy, no carotid bruit, no JVD, supple, symmetrical, trachea midline and thyroid not  enlarged, symmetric, no tenderness/mass/nodules Back: symmetric, no curvature. ROM normal. No CVA tenderness. Lungs: clear to auscultation bilaterally Breasts: normal appearance, no masses or tenderness Heart: regular rate and rhythm, S1, S2 normal, no murmur, click, rub or gallop Abdomen: soft, non-tender; bowel sounds normal; no masses,  no organomegaly Pelvic: cervix normal in appearance, external genitalia normal, no adnexal masses or tenderness, no cervical motion tenderness, uterus normal size, shape, and consistency and vagina normal without discharge Extremities: extremities normal, atraumatic, no cyanosis or edema Pulses: 2+ and symmetric Skin: Skin color, texture, turgor normal. No rashes or lesions Lymph nodes: Cervical, supraclavicular, and axillary nodes normal. Neurologic: Grossly normal    Assessment:    Healthy female exam.      Plan:    CPE- pt has had labs drawn. Pap done today. Breast exam normal. Not 40 yet for mammogram. Discussed vitamin d 800 units and calcium .   Morbid obesity- refilled phentermine. Follow up in one month. Continue diet and lifestyle changes. May consider transition to saxenda in next few months to further weight loss.   Elevated BP- will continue to monitor. Hx of better readings. She was nervous about pap smear. Discussed low salt diet.  See After Visit Summary for Counseling Recommendations

## 2015-03-15 LAB — CYTOLOGY - PAP

## 2015-03-16 LAB — CERVICOVAGINAL ANCILLARY ONLY
BACTERIAL VAGINITIS: NEGATIVE
Candida vaginitis: NEGATIVE

## 2015-03-18 LAB — CERVICOVAGINAL ANCILLARY ONLY: HERPES (WINDOWPATH): NEGATIVE

## 2015-04-07 ENCOUNTER — Ambulatory Visit (INDEPENDENT_AMBULATORY_CARE_PROVIDER_SITE_OTHER): Payer: BLUE CROSS/BLUE SHIELD | Admitting: Family Medicine

## 2015-04-07 VITALS — BP 127/76 | HR 90 | Wt 234.0 lb

## 2015-04-07 DIAGNOSIS — R635 Abnormal weight gain: Secondary | ICD-10-CM | POA: Diagnosis not present

## 2015-04-07 MED ORDER — PHENTERMINE HCL 37.5 MG PO CAPS: 37.5000 mg | ORAL_CAPSULE | ORAL | Status: DC

## 2015-04-07 NOTE — Progress Notes (Signed)
Patient is here for blood pressure and weight check. Denies any trouble sleeping, palpitations, or any other medication problems. Patient has not lost weight. A refill for Phentermine will be sent to Provider in office for review. Patient advised we would contact her regarding Rx status and if new refill is sent she will need to schedule a four week nurse visit and keep her upcoming appointment with her PCP. Verbalized understanding, no further questions.

## 2015-04-07 NOTE — Progress Notes (Signed)
Pt notified of Rx approval and informed to make her next appt with PCP. Verbalized understanding, no further questions.

## 2015-04-07 NOTE — Progress Notes (Signed)
She did not lose weight. I will refill the medication for 1 more month that she needs to schedule an appointment with her PCP. I noted he had previously discussed Sexenda.

## 2015-05-13 ENCOUNTER — Ambulatory Visit: Payer: BLUE CROSS/BLUE SHIELD

## 2015-05-13 ENCOUNTER — Ambulatory Visit (INDEPENDENT_AMBULATORY_CARE_PROVIDER_SITE_OTHER): Payer: BLUE CROSS/BLUE SHIELD | Admitting: Physician Assistant

## 2015-05-13 ENCOUNTER — Encounter: Payer: Self-pay | Admitting: Physician Assistant

## 2015-05-13 VITALS — BP 121/78 | HR 94 | Wt 227.0 lb

## 2015-05-13 DIAGNOSIS — R635 Abnormal weight gain: Secondary | ICD-10-CM | POA: Diagnosis not present

## 2015-05-13 DIAGNOSIS — E669 Obesity, unspecified: Secondary | ICD-10-CM | POA: Diagnosis not present

## 2015-05-13 DIAGNOSIS — Z3042 Encounter for surveillance of injectable contraceptive: Secondary | ICD-10-CM | POA: Diagnosis not present

## 2015-05-13 MED ORDER — MEDROXYPROGESTERONE ACETATE 150 MG/ML IM SUSP
150.0000 mg | Freq: Once | INTRAMUSCULAR | Status: AC
  Administered 2015-05-13: 150 mg via INTRAMUSCULAR

## 2015-05-13 MED ORDER — LORCASERIN HCL 10 MG PO TABS: ORAL_TABLET | ORAL | Status: DC

## 2015-05-13 MED ORDER — PHENTERMINE HCL 37.5 MG PO CAPS: 37.5000 mg | ORAL_CAPSULE | ORAL | Status: DC

## 2015-05-13 NOTE — Progress Notes (Signed)
Patient tolerated depo injection in right deltoid well without complications. Given form with dates for her next injection.

## 2015-05-16 NOTE — Progress Notes (Signed)
   Subjective:    Patient ID: Tina Boone, female    DOB: 1975/12/13, 40 y.o.   MRN: [id number]  HPI Pt presents to the clinic for depo shot and to discuss ongoing weight loss. She has been on phentermine for the last 9 months. She has done great on it with little to no side effects. occasionally she will have constipation and need stool softener or miralax.She has lost around 70lbs. She lost 7lbs in the last month. She would like to transition to something she can stay on long term. She is exercising regularly and motivated to getting her 200lbs.    Review of Systems  All other systems reviewed and are negative.      Objective:   Physical Exam  Constitutional: She is oriented to person, place, and time. She appears well-developed and well-nourished.  HENT:  Head: Normocephalic and atraumatic.  Cardiovascular: Normal rate, regular rhythm and normal heart sounds.   Pulmonary/Chest: Effort normal and breath sounds normal.  Neurological: She is alert and oriented to person, place, and time.  Psychiatric: She has a normal mood and affect. Her behavior is normal.          Assessment & Plan:  Depo provera given in office today.   Obese/abnormal weight gain- discussed ways to help continue to lose weight. Phentermine given to cut in half for next 2 months. Start belviq. Discussed side effects. Follow up in 2 months.

## 2015-05-26 ENCOUNTER — Telehealth: Payer: Self-pay | Admitting: *Deleted

## 2015-05-26 NOTE — Telephone Encounter (Signed)
Belviq has been approved through insurance   Pt notified via vm

## 2015-07-15 ENCOUNTER — Encounter: Payer: Self-pay | Admitting: Physician Assistant

## 2015-07-15 ENCOUNTER — Ambulatory Visit (INDEPENDENT_AMBULATORY_CARE_PROVIDER_SITE_OTHER): Payer: BLUE CROSS/BLUE SHIELD | Admitting: Physician Assistant

## 2015-07-15 ENCOUNTER — Other Ambulatory Visit: Payer: Self-pay | Admitting: Physician Assistant

## 2015-07-15 VITALS — BP 131/72 | HR 82 | Ht 67.0 in | Wt 236.0 lb

## 2015-07-15 DIAGNOSIS — E669 Obesity, unspecified: Secondary | ICD-10-CM

## 2015-07-15 DIAGNOSIS — E039 Hypothyroidism, unspecified: Secondary | ICD-10-CM | POA: Diagnosis not present

## 2015-07-15 DIAGNOSIS — R635 Abnormal weight gain: Secondary | ICD-10-CM | POA: Diagnosis not present

## 2015-07-15 LAB — TSH: TSH: 4.38 m[IU]/L

## 2015-07-15 MED ORDER — LORCASERIN HCL 10 MG PO TABS: ORAL_TABLET | ORAL | Status: DC

## 2015-07-15 NOTE — Progress Notes (Signed)
   Subjective:    Patient ID: Tina Boone, female    DOB: 01/24/1976, 40 y.o.   MRN: [id number]  HPI Pt is a 40 yo female who presents to the clinic for follow up on obesity. She has had a rough 2 months. She was doing so well and then her mother was diagnosed with pancreatic cancer. Her ex-husband decided to stop paying child support. She has been really stressed. She has been stress eating. She gained 9lbs.  She is taking belviq daily. Denies any side effects. She has decreased exercising as well. Just this week started back eating better. She lost 4lbs in last 10 days.    Review of Systems  All other systems reviewed and are negative.      Objective:   Physical Exam  Constitutional: She is oriented to person, place, and time. She appears well-developed and well-nourished.  Obese.   HENT:  Head: Normocephalic and atraumatic.  Neck: Normal range of motion. Neck supple. No thyromegaly present.  Cardiovascular: Normal rate, regular rhythm and normal heart sounds.   Pulmonary/Chest: Effort normal and breath sounds normal.  Lymphadenopathy:    She has no cervical adenopathy.  Neurological: She is alert and oriented to person, place, and time.  Psychiatric: She has a normal mood and affect. Her behavior is normal.          Assessment & Plan:  Obesity/abnormal weight gain- continue belviq. Discussed and encouraged to take care of herself. Follow up in 2 months.   Hypothyroidism- will check TSH and adjust accordingly.

## 2015-08-05 ENCOUNTER — Ambulatory Visit: Payer: BLUE CROSS/BLUE SHIELD

## 2015-08-12 ENCOUNTER — Ambulatory Visit (INDEPENDENT_AMBULATORY_CARE_PROVIDER_SITE_OTHER): Payer: BLUE CROSS/BLUE SHIELD | Admitting: Physician Assistant

## 2015-08-12 VITALS — BP 126/85 | HR 80 | Wt 255.0 lb

## 2015-08-12 DIAGNOSIS — Z3042 Encounter for surveillance of injectable contraceptive: Secondary | ICD-10-CM | POA: Diagnosis not present

## 2015-08-12 MED ORDER — MEDROXYPROGESTERONE ACETATE 150 MG/ML IM SUSP
150.0000 mg | Freq: Once | INTRAMUSCULAR | Status: AC
  Administered 2015-08-12: 150 mg via INTRAMUSCULAR

## 2015-08-12 NOTE — Progress Notes (Signed)
   Subjective:    Patient ID: Tina Boone, female    DOB: 02-Jun-1975, 40 y.o.   MRN: [id number]  HPI Trula OreChristina is here for d Depo Provera injection. Denies any medication problems.    Review of Systems     Objective:   Physical Exam        Assessment & Plan:  Contraception Management - Patient tolerated injection well without complications. Patient advised to return between Sept 22- Oct-06 for next Depo Provera injection.

## 2015-08-26 ENCOUNTER — Telehealth: Payer: Self-pay | Admitting: *Deleted

## 2015-08-26 DIAGNOSIS — E039 Hypothyroidism, unspecified: Secondary | ICD-10-CM

## 2015-08-26 NOTE — Telephone Encounter (Signed)
Labs ordered.

## 2015-09-03 LAB — TSH: TSH: 5.68 mIU/L — ABNORMAL HIGH

## 2015-09-05 MED ORDER — LEVOTHYROXINE SODIUM 100 MCG PO TABS: 100.0000 ug | ORAL_TABLET | Freq: Every day | ORAL | 1 refills | Status: DC

## 2015-09-05 NOTE — Addendum Note (Signed)
Addended by: Jomarie Longs on: 09/05/2015 12:57 PM   Modules accepted: Orders

## 2015-09-07 ENCOUNTER — Ambulatory Visit (INDEPENDENT_AMBULATORY_CARE_PROVIDER_SITE_OTHER): Payer: BLUE CROSS/BLUE SHIELD | Admitting: Physician Assistant

## 2015-09-07 ENCOUNTER — Encounter: Payer: Self-pay | Admitting: Physician Assistant

## 2015-09-07 VITALS — BP 122/65 | HR 79 | Ht 67.0 in | Wt 256.0 lb

## 2015-09-07 DIAGNOSIS — Z6841 Body Mass Index (BMI) 40.0 and over, adult: Secondary | ICD-10-CM

## 2015-09-07 MED ORDER — NALTREXONE-BUPROPION HCL ER 8-90 MG PO TB12: ORAL_TABLET | ORAL | 0 refills | Status: DC

## 2015-09-07 NOTE — Progress Notes (Addendum)
   Subjective:    Patient ID: Burnadine Fullam, female    DOB: 11/21/1975, 40 y.o.   MRN: [id number]  HPI Rainbow Naidu is a 40 year old female that presents to clinic for follow up on obesity. She reports having a very stressful past few months. She has gained 20lbs since June. She states has been working 2-3 jobs to help earn extra money for daughter who is starting college, is helping with her mother's pancreatic cancer treatment and recently had a great aunt died last week. She reports not cooking at home or exercising regularly. She denies sleep changes, loss of interest in activities, trouble concentration or thoughts of helplessness or hopelessness.   She has been taking one pill of Bleviq each day rather than two pills like prescribed. She states she didn't know that she was suppose to take more than pill a day. She last week she was told she needed to increase her synthroid dose because of high TSH but has yet to do that.   Review of Systems  All other systems reviewed and are negative.      Objective:   Physical Exam  Constitutional: She is oriented to person, place, and time. She appears well-developed and well-nourished.  Neck: Normal range of motion. Neck supple.  Cardiovascular: Normal rate, regular rhythm and normal heart sounds.   Pulmonary/Chest: Effort normal.  Neurological: She is alert and oriented to person, place, and time.  Skin: Skin is warm and dry.  Psychiatric: She has a normal mood and affect. Her behavior is normal.      Assessment & Plan:  Obesity-  Stressed the importance of diet and exercise for weight loss.  Discussed the proper way to take Belviq. She wants to stay on Belviq for a little while longer to see if her weight loss will improve with taking the medication correctly. Since she was able to lose weight earlier this year, I want to help her not gain back what she has lost and because she has not lost 5% body weight in three months it might be  hard to have insurance continue to pay for Belviq.   Given prescription for Contrave when she stops Belviq so that she can continue working toward weight loss. Reviewed the proper way to take this medication: Take 1 tablet in morning for 1 week, then increase to 1 tablet in morning and 1 tablet in evening for 1 week, then increase to 2 tablets in morning and 1 tablet in evening for 1 week, then increase to 2 tablets in morning and 2 tablets in evening daily. Follow up in 3 months

## 2015-09-07 NOTE — Patient Instructions (Signed)
Online- coupon

## 2015-09-16 ENCOUNTER — Ambulatory Visit: Payer: BLUE CROSS/BLUE SHIELD | Admitting: Physician Assistant

## 2015-09-23 ENCOUNTER — Ambulatory Visit (INDEPENDENT_AMBULATORY_CARE_PROVIDER_SITE_OTHER): Payer: BLUE CROSS/BLUE SHIELD | Admitting: Physician Assistant

## 2015-09-23 ENCOUNTER — Encounter: Payer: Self-pay | Admitting: Physician Assistant

## 2015-09-23 VITALS — BP 116/69 | HR 67 | Ht 67.0 in | Wt 261.0 lb

## 2015-09-23 DIAGNOSIS — E781 Pure hyperglyceridemia: Secondary | ICD-10-CM | POA: Diagnosis not present

## 2015-09-23 DIAGNOSIS — Z131 Encounter for screening for diabetes mellitus: Secondary | ICD-10-CM

## 2015-09-23 DIAGNOSIS — Z79899 Other long term (current) drug therapy: Secondary | ICD-10-CM

## 2015-09-23 DIAGNOSIS — E039 Hypothyroidism, unspecified: Secondary | ICD-10-CM | POA: Diagnosis not present

## 2015-09-23 DIAGNOSIS — Z6841 Body Mass Index (BMI) 40.0 and over, adult: Secondary | ICD-10-CM

## 2015-09-23 NOTE — Progress Notes (Signed)
   Subjective:    Patient ID: Tina Boone, female    DOB: 23-May-1975, 40 y.o.   MRN: [id number]  HPI Patient is a 40 year old female who presents to the clinic to get labs ordered in follow-up on weight loss. It is only been one month since she was seen last. She has gained another 4 pounds since last visit. She has started taken her bone week twice a day. She feels like it is helping her with her cravings.   Review of Systems  All other systems reviewed and are negative.      Objective:   Physical Exam  Constitutional: She is oriented to person, place, and time. She appears well-developed and well-nourished.  Obese  HENT:  Head: Normocephalic and atraumatic.  Cardiovascular: Normal rate, regular rhythm and normal heart sounds.   Pulmonary/Chest: Effort normal and breath sounds normal.  Neurological: She is alert and oriented to person, place, and time.  Psychiatric: She has a normal mood and affect. Her behavior is normal.          Assessment & Plan:  Morbid obesity- continue on belviq. Left hip with patient if she feels like she'll like to try something different she has prescription for contrave. Continue with diet and exercise.   Hypertriglyceridemia- lipid to be rechecked.   Hypothyroidism- TSH to be rechecked. Medication to be adjusted accordingly.

## 2015-10-22 ENCOUNTER — Encounter: Payer: Self-pay | Admitting: Physician Assistant

## 2015-10-22 LAB — COMPLETE METABOLIC PANEL WITH GFR
ALBUMIN: 4.4 g/dL (ref 3.6–5.1)
ALK PHOS: 70 U/L (ref 33–115)
ALT: 16 U/L (ref 6–29)
AST: 13 U/L (ref 10–30)
BILIRUBIN TOTAL: 0.5 mg/dL (ref 0.2–1.2)
BUN: 13 mg/dL (ref 7–25)
CALCIUM: 9.6 mg/dL (ref 8.6–10.2)
CO2: 23 mmol/L (ref 20–31)
Chloride: 106 mmol/L (ref 98–110)
Creat: 0.72 mg/dL (ref 0.50–1.10)
Glucose, Bld: 90 mg/dL (ref 65–99)
Potassium: 4.4 mmol/L (ref 3.5–5.3)
Sodium: 142 mmol/L (ref 135–146)
TOTAL PROTEIN: 7.2 g/dL (ref 6.1–8.1)

## 2015-10-22 LAB — LIPID PANEL
Cholesterol: 161 mg/dL (ref 125–200)
HDL: 41 mg/dL — ABNORMAL LOW (ref >=46)
LDL CALC: 105 mg/dL (ref <130)
TRIGLYCERIDES: 75 mg/dL (ref <150)
Total CHOL/HDL Ratio: 3.9 Ratio (ref <=5.0)
VLDL: 15 mg/dL (ref <30)

## 2015-10-22 LAB — T4, FREE: FREE T4: 1.3 ng/dL (ref 0.8–1.8)

## 2015-10-22 LAB — TSH: TSH: 2.9 m[IU]/L

## 2015-10-28 ENCOUNTER — Ambulatory Visit (INDEPENDENT_AMBULATORY_CARE_PROVIDER_SITE_OTHER): Payer: BLUE CROSS/BLUE SHIELD | Admitting: Physician Assistant

## 2015-10-28 VITALS — BP 121/88 | HR 84 | Wt 263.0 lb

## 2015-10-28 DIAGNOSIS — Z3042 Encounter for surveillance of injectable contraceptive: Secondary | ICD-10-CM

## 2015-10-28 MED ORDER — MEDROXYPROGESTERONE ACETATE 150 MG/ML IM SUSP
150.0000 mg | Freq: Once | INTRAMUSCULAR | Status: AC
  Administered 2015-10-28: 150 mg via INTRAMUSCULAR

## 2015-10-28 NOTE — Progress Notes (Signed)
Pt came into clinic today for birth control injection. Denies any problems associated with medication. Pt tolerated Depo shot in right deltoid, per Pt request. Pt was given information on when next shot will be due, scheduled appt prior to leaving clinic today. No further questions/concerns.

## 2015-11-17 ENCOUNTER — Other Ambulatory Visit: Payer: Self-pay | Admitting: Physician Assistant

## 2015-12-22 ENCOUNTER — Other Ambulatory Visit: Payer: Self-pay | Admitting: Physician Assistant

## 2015-12-23 ENCOUNTER — Ambulatory Visit (INDEPENDENT_AMBULATORY_CARE_PROVIDER_SITE_OTHER): Payer: BLUE CROSS/BLUE SHIELD | Admitting: Physician Assistant

## 2015-12-23 ENCOUNTER — Encounter: Payer: Self-pay | Admitting: Physician Assistant

## 2015-12-23 VITALS — BP 137/86 | HR 95 | Ht 67.0 in | Wt 281.0 lb

## 2015-12-23 DIAGNOSIS — E039 Hypothyroidism, unspecified: Secondary | ICD-10-CM

## 2015-12-23 DIAGNOSIS — Z23 Encounter for immunization: Secondary | ICD-10-CM | POA: Diagnosis not present

## 2015-12-23 DIAGNOSIS — Z6841 Body Mass Index (BMI) 40.0 and over, adult: Secondary | ICD-10-CM | POA: Diagnosis not present

## 2015-12-23 MED ORDER — LEVOTHYROXINE SODIUM 100 MCG PO TABS: 100.0000 ug | ORAL_TABLET | Freq: Every day | ORAL | 0 refills | Status: DC

## 2015-12-23 NOTE — Progress Notes (Signed)
   Subjective:    Patient ID: Tina Boone, female    DOB: 09/28/1975, 40 y.o.   MRN: [id number]  HPI  Pt is a 40 yo female who presents to the clinic for 3 month follow up. She needs thyroid rechecked. She is on levothyroxine. She had done really well and lost 30lbs she has recently gained it all back. She has no time for exercise. Mother has pancreatic cancer and she is very stress about this.     Review of Systems  All other systems reviewed and are negative.      Objective:   Physical Exam  Constitutional: She is oriented to person, place, and time. She appears well-developed and well-nourished.  Morbid obesity.   HENT:  Head: Normocephalic and atraumatic.  Cardiovascular: Normal rate, regular rhythm and normal heart sounds.   Pulmonary/Chest: Effort normal and breath sounds normal.  Neurological: She is alert and oriented to person, place, and time.  Psychiatric: She has a normal mood and affect. Her behavior is normal.          Assessment & Plan:  Marland Kitchen.Marland Kitchen.Diagnoses and all orders for this visit:  Hypothyroidism, unspecified type -     TSH -     T4, free -     levothyroxine (SYNTHROID, LEVOTHROID) 100 MCG tablet; Take 1 tablet (100 mcg total) by mouth daily.  Influenza vaccine needed -     Flu Vaccine QUAD 36+ mos PF IM (Fluarix & Fluzone Quad PF)  Morbid obesity with BMI of 45.0-49.9, adult (HCC)   Discussed weight loss. Insurance will not cover medications for weight loss. Pt is under a lot of stress. Discussed trying to make healthier choices.

## 2015-12-23 NOTE — Patient Instructions (Addendum)
saxenda to call insurance qsymia

## 2016-01-07 LAB — T4, FREE: FREE T4: 1.3 ng/dL (ref 0.8–1.8)

## 2016-01-07 LAB — TSH: TSH: 2.93 m[IU]/L

## 2016-01-09 ENCOUNTER — Other Ambulatory Visit: Payer: Self-pay

## 2016-01-09 DIAGNOSIS — E039 Hypothyroidism, unspecified: Secondary | ICD-10-CM

## 2016-01-09 MED ORDER — LEVOTHYROXINE SODIUM 100 MCG PO TABS: 100.0000 ug | ORAL_TABLET | Freq: Every day | ORAL | 5 refills | Status: DC

## 2016-01-13 ENCOUNTER — Ambulatory Visit: Payer: BLUE CROSS/BLUE SHIELD

## 2016-01-17 ENCOUNTER — Ambulatory Visit (INDEPENDENT_AMBULATORY_CARE_PROVIDER_SITE_OTHER): Payer: BLUE CROSS/BLUE SHIELD | Admitting: Physician Assistant

## 2016-01-17 VITALS — BP 131/84 | HR 88

## 2016-01-17 DIAGNOSIS — Z3042 Encounter for surveillance of injectable contraceptive: Secondary | ICD-10-CM | POA: Diagnosis not present

## 2016-01-17 MED ORDER — MEDROXYPROGESTERONE ACETATE 150 MG/ML IM SUSP
150.0000 mg | Freq: Once | INTRAMUSCULAR | Status: AC
  Administered 2016-01-17: 150 mg via INTRAMUSCULAR

## 2016-01-17 NOTE — Progress Notes (Signed)
Pt came into clinic today for birth control injection. Denies any problems associated with medication. Pt tolerated Depo shot in left deltoid, per Pt request. Pt was given information on when next shot will be due, scheduled appt prior to leaving clinic today. No further questions/concerns.

## 2016-06-22 ENCOUNTER — Ambulatory Visit (INDEPENDENT_AMBULATORY_CARE_PROVIDER_SITE_OTHER): Payer: BLUE CROSS/BLUE SHIELD | Admitting: Physician Assistant

## 2016-06-22 ENCOUNTER — Encounter: Payer: Self-pay | Admitting: Physician Assistant

## 2016-06-22 VITALS — BP 130/86 | HR 88 | Ht 67.0 in | Wt 289.0 lb

## 2016-06-22 DIAGNOSIS — F4323 Adjustment disorder with mixed anxiety and depressed mood: Secondary | ICD-10-CM | POA: Insufficient documentation

## 2016-06-22 DIAGNOSIS — E039 Hypothyroidism, unspecified: Secondary | ICD-10-CM

## 2016-06-22 NOTE — Progress Notes (Signed)
   Subjective:    Patient ID: Tina Boone, female    DOB: 09-22-1975, 41 y.o.   MRN: [id number]  HPI  Pt is a 41 yo female who presents to the clinic for medication refill on levothyroxine. She has no complaints.   She has had a rough 6 months. Her mother died in January. She walked out on her job in march. Her daughter moved out in April and not talking to her. She is a single mother. She is very tearful. She continues to do what she needs to do. She is sad but she denies feeling depressed.    Review of Systems  All other systems reviewed and are negative.      Objective:   Physical Exam  Constitutional: She is oriented to person, place, and time. She appears well-developed and well-nourished.  HENT:  Head: Normocephalic and atraumatic.  Eyes: Conjunctivae are normal.  Neck: Normal range of motion. Neck supple.  Cardiovascular: Normal rate, regular rhythm and normal heart sounds.   Pulmonary/Chest: Effort normal and breath sounds normal.  Lymphadenopathy:    She has no cervical adenopathy.  Neurological: She is alert and oriented to person, place, and time.  Psychiatric: She has a normal mood and affect. Her behavior is normal.          Assessment & Plan:  Marland Kitchen.Marland Kitchen.Trula OreChristina was seen today for hypothyroidism.  Diagnoses and all orders for this visit:  Hypothyroidism, unspecified type -     TSH -     T4, free  Adjustment disorder with mixed anxiety and depressed mood   .will adjust thyroid as needed.   Encouraged grief counseling with hospice. Discussed medication. Pt declines today. Discussed she can always call back or follow up if she feels like she needs medications.

## 2016-06-23 LAB — T4, FREE: FREE T4: 1.3 ng/dL (ref 0.8–1.8)

## 2016-06-23 LAB — TSH: TSH: 5.48 mIU/L — ABNORMAL HIGH

## 2016-06-25 MED ORDER — LEVOTHYROXINE SODIUM 112 MCG PO TABS
112.0000 ug | ORAL_TABLET | Freq: Every day | ORAL | 1 refills | Status: DC
Start: 2016-06-25 — End: 2016-09-12

## 2016-06-25 NOTE — Addendum Note (Signed)
Addended by: Jomarie LongsBREEBACK, JADE L on: 06/25/2016 03:54 PM   Modules accepted: Orders

## 2016-09-12 ENCOUNTER — Other Ambulatory Visit: Payer: Self-pay | Admitting: Physician Assistant

## 2016-09-12 DIAGNOSIS — E038 Other specified hypothyroidism: Secondary | ICD-10-CM

## 2016-10-09 ENCOUNTER — Other Ambulatory Visit: Payer: Self-pay | Admitting: Physician Assistant

## 2016-10-09 DIAGNOSIS — E038 Other specified hypothyroidism: Secondary | ICD-10-CM

## 2016-10-25 LAB — TSH: TSH: 3.21 mIU/L

## 2016-10-26 ENCOUNTER — Other Ambulatory Visit: Payer: Self-pay

## 2016-10-26 DIAGNOSIS — E038 Other specified hypothyroidism: Secondary | ICD-10-CM

## 2016-10-26 MED ORDER — LEVOTHYROXINE SODIUM 112 MCG PO TABS: 112.0000 ug | ORAL_TABLET | Freq: Every day | ORAL | 5 refills | Status: DC

## 2016-12-24 ENCOUNTER — Ambulatory Visit (INDEPENDENT_AMBULATORY_CARE_PROVIDER_SITE_OTHER): Payer: BLUE CROSS/BLUE SHIELD | Admitting: Physician Assistant

## 2016-12-24 ENCOUNTER — Encounter: Payer: Self-pay | Admitting: Physician Assistant

## 2016-12-24 VITALS — BP 133/75 | HR 78 | Ht 67.01 in | Wt 298.0 lb

## 2016-12-24 DIAGNOSIS — Z23 Encounter for immunization: Secondary | ICD-10-CM | POA: Diagnosis not present

## 2016-12-24 DIAGNOSIS — I10 Essential (primary) hypertension: Secondary | ICD-10-CM

## 2016-12-24 DIAGNOSIS — E039 Hypothyroidism, unspecified: Secondary | ICD-10-CM

## 2016-12-24 DIAGNOSIS — Z131 Encounter for screening for diabetes mellitus: Secondary | ICD-10-CM

## 2016-12-24 DIAGNOSIS — Z13220 Encounter for screening for lipoid disorders: Secondary | ICD-10-CM

## 2016-12-24 NOTE — Progress Notes (Signed)
   Subjective:    Patient ID: Tina Boone, female    DOB: 02-Aug-1975, 41 y.o.   MRN: [id number]  HPI Patient is a 41 year old morbidly obese  female who presents to the clinic for follow-up.  She has a past medical history of hypothyroidism, hypertension.  She is taking her levothyroxine daily without any complications.  She denies any side effects or overt concerns.  Hypertension-patient is currently not taking any medications.  She denies any chest pains, palpitations, headaches or vision changes.  Patient has had a very difficult year.  She lost her mother.  She walked out on her job in April and has not been able to find another job.  She admits she is not exercising or watching what she is eating.  She done well and lost a significant amount of weight but has gained it all back.  .. Active Ambulatory Problems    Diagnosis Date Noted  . Morbid obesity with BMI of 45.0-49.9, adult (HCC) 01/23/2012  . Well adult exam 01/23/2012  . Contraception management 03/06/2013  . Elevated blood pressure 03/06/2013  . Essential hypertension, benign 04/01/2013  . Hypothyroidism 04/03/2013  . Hypertriglyceridemia without hypercholesterolemia 09/23/2015  . Adjustment disorder with mixed anxiety and depressed mood 06/22/2016   Resolved Ambulatory Problems    Diagnosis Date Noted  . No Resolved Ambulatory Problems   No Additional Past Medical History      Review of Systems  All other systems reviewed and are negative.      Objective:   Physical Exam  Constitutional: She is oriented to person, place, and time. She appears well-developed and well-nourished.  Morbidly obese.   HENT:  Head: Normocephalic and atraumatic.  Neck: Normal range of motion. Neck supple. No thyromegaly present.  Cardiovascular: Normal rate, regular rhythm and normal heart sounds.  Pulmonary/Chest: Effort normal and breath sounds normal.  Neurological: She is alert and oriented to person, place, and time.   Psychiatric: She has a normal mood and affect. Her behavior is normal.          Assessment & Plan:  Marland Kitchen.Marland Kitchen.Tina Boone was seen today for follow-up.  Diagnoses and all orders for this visit:  Essential hypertension, benign  Hypothyroidism, unspecified type -     TSH  Influenza vaccine needed -     Flu Vaccine QUAD 6+ mos PF IM (Fluarix Quad PF)  Screening for lipid disorders -     Lipid Panel w/reflex Direct LDL  Screening for diabetes mellitus -     COMPLETE METABOLIC PANEL WITH GFR    Depression screen [facility] 2/9 12/24/2016 03/06/2013  Decreased Interest 0 0  Down, Depressed, Hopeless 0 0  PHQ - 2 Score 0 0   Not quite due for labs. Printed to get drawn in next 3 months.  Marland Kitchen..Discussed low carb diet with 1500 calories and 80g of protein.  Exercising at least 150 minutes a week.  My Fitness Pal could be a [employer] resource.  I really feel like patient losing weight would be very beneficial for her.  At this point she declines any medications.

## 2017-01-21 ENCOUNTER — Encounter: Payer: Self-pay | Admitting: Emergency Medicine

## 2017-01-21 ENCOUNTER — Emergency Department
Admission: EM | Admit: 2017-01-21 | Discharge: 2017-01-21 | Disposition: A | Discharge status: Home / Self Care | Payer: BLUE CROSS/BLUE SHIELD | Source: Home / Self Care | Attending: Family Medicine | Admitting: Family Medicine

## 2017-01-21 DIAGNOSIS — B9789 Other viral agents as the cause of diseases classified elsewhere: Secondary | ICD-10-CM | POA: Diagnosis not present

## 2017-01-21 DIAGNOSIS — J9801 Acute bronchospasm: Secondary | ICD-10-CM

## 2017-01-21 DIAGNOSIS — J069 Acute upper respiratory infection, unspecified: Secondary | ICD-10-CM | POA: Diagnosis not present

## 2017-01-21 MED ORDER — IPRATROPIUM-ALBUTEROL 0.5-2.5 (3) MG/3ML IN SOLN
3.0000 mL | Freq: Once | RESPIRATORY_TRACT | Status: AC
  Administered 2017-01-21: 3 mL via RESPIRATORY_TRACT

## 2017-01-21 MED ORDER — BENZONATATE 200 MG PO CAPS: ORAL_CAPSULE | ORAL | 0 refills | Status: DC

## 2017-01-21 MED ORDER — AZITHROMYCIN 250 MG PO TABS: ORAL_TABLET | ORAL | 0 refills | Status: DC

## 2017-01-21 MED ORDER — METHYLPREDNISOLONE SODIUM SUCC 125 MG IJ SOLR
80.0000 mg | Freq: Once | INTRAMUSCULAR | Status: AC
  Administered 2017-01-21: 80 mg via INTRAMUSCULAR

## 2017-01-21 MED ORDER — PREDNISONE 20 MG PO TABS: ORAL_TABLET | ORAL | 0 refills | Status: DC

## 2017-01-21 NOTE — ED Triage Notes (Signed)
Pt c/o productive cough, sinus pressure and facial pain x5 days. States she has been taking robittusin.

## 2017-01-21 NOTE — Discharge Instructions (Signed)
Begin prednisone Tuesday 01/22/17. Take plain guaifenesin (1200mg  extended release tabs such as Mucinex) twice daily, with plenty of water, for cough and congestion.  May add Pseudoephedrine (30mg , one or two every 4 to 6 hours) for sinus congestion.  Get adequate rest.   May use Afrin nasal spray (or generic oxymetazoline) each morning for about 5 days and then discontinue.  Also recommend using saline nasal spray several times daily and saline nasal irrigation (AYR is a common brand).  Use Flonase nasal spray each morning after using Afrin nasal spray and saline nasal irrigation. Try warm salt water gargles for sore throat.  Stop all antihistamines for now, and other non-prescription cough/cold preparations.

## 2017-01-21 NOTE — ED Provider Notes (Signed)
[facility]    CSN: [id number] Arrival date & time: 01/21/17  1508     History   Chief Complaint Chief Complaint  Patient presents with  . Cough    HPI Tina Boone is a 41 y.o. female.   Patient complains of six day history of typical cold-like symptoms developing over several days, including mild sore throat, sinus congestion, fatigue, low grade fever, and cough.  She has become more easily winded with activity.  She has a history of seasonal rhinitis.   The history is provided by the patient.    History reviewed. No pertinent past medical history.  Patient Active Problem List   Diagnosis Date Noted  . Adjustment disorder with mixed anxiety and depressed mood 06/22/2016  . Hypertriglyceridemia without hypercholesterolemia 09/23/2015  . Hypothyroidism 04/03/2013  . Essential hypertension, benign 04/01/2013  . Contraception management 03/06/2013  . Elevated blood pressure 03/06/2013  . Morbid obesity with BMI of 45.0-49.9, adult (HCC) 01/23/2012  . Well adult exam 01/23/2012    Past Surgical History:  Procedure Laterality Date  . CHOLECYSTECTOMY  2009  . TONSILECTOMY/ADENOIDECTOMY WITH MYRINGOTOMY  12/2010    OB History    No data available       Home Medications    Prior to Admission medications   Medication Sig Start Date End Date Taking? Authorizing Provider  azithromycin (ZITHROMAX Z-PAK) 250 MG tablet Take 2 tabs today; then begin one tab once daily for 4 more days. 01/21/17   Lattie HawBeese, Mahogani Holohan A, MD  benzonatate (TESSALON) 200 MG capsule Take one cap by mouth at bedtime as needed for cough.  May repeat in 4 to 6 hours 01/21/17   Lattie HawBeese, Keilin Gamboa A, MD  calcium-vitamin D (OSCAL WITH D) 250-125 MG-UNIT tablet Take 1 tablet by mouth daily.    [provider]  levothyroxine (SYNTHROID, LEVOTHROID) 112 MCG tablet Take 1 tablet (112 mcg total) by mouth daily before breakfast. 10/26/16   Breeback, Jade L, PA-C  predniSONE (DELTASONE) 20  MG tablet Take one tab by mouth twice daily for 4 days, then one daily for 3 days. Take with food. 01/21/17   Lattie HawBeese, Felita Bump A, MD    Family History Family History  Problem Relation Age of Onset  . Heart disease Father   . Stroke Brother   . Cancer Paternal Aunt        leukemia  . Cancer Paternal Grandmother [id number]       breast  . Stroke Paternal Grandfather   . Cancer Mother        pancreatic    Social History Social History   Tobacco Use  . Smoking status: Never Smoker  . Smokeless tobacco: Never Used  Substance Use Topics  . Alcohol use: No  . Drug use: No     Allergies   Patient has no known allergies.   Review of Systems Review of Systems + sore throat + cough No pleuritic pain No wheezing + nasal congestion + post-nasal drainage + sinus pain/pressure No itchy/red eyes No earache No hemoptysis + SOB + fever, + chills No nausea No vomiting No abdominal pain No diarrhea No urinary symptoms No skin rash + fatigue No myalgias No headache Used OTC meds without relief   Physical Exam Triage Vital Signs ED Triage Vitals  Enc Vitals Group     BP 01/21/17 1615 (!) 133/95     Pulse Rate 01/21/17 1615 100     Resp --      Temp 01/21/17 1615  98.6 F (37 C)     Temp Source 01/21/17 1615 Oral     SpO2 01/21/17 1615 99 %     Weight 01/21/17 1616 298 lb (135.2 kg)     Height --      Head Circumference --      Peak Flow --      Pain Score 01/21/17 1616 0     Pain Loc --      Pain Edu? --      Excl. in GC? --    No data found.  Updated Vital Signs BP (!) 133/95 (BP Location: Right Arm)   Pulse 100   Temp 98.6 F (37 C) (Oral)   Wt 298 lb (135.2 kg)   LMP 12/26/2016   SpO2 99%   BMI 46.66 kg/m   Visual Acuity Right Eye Distance:   Left Eye Distance:   Bilateral Distance:    Right Eye Near:   Left Eye Near:    Bilateral Near:     Physical Exam Nursing notes and Vital Signs reviewed. Appearance:  Patient appears stated age, and in no  acute distress Eyes:  Pupils are equal, round, and reactive to light and accomodation.  Extraocular movement is intact.  Conjunctivae are not inflamed  Ears:  Canals normal.  Tympanic membranes normal.  Nose:  Congested turbinates.  No sinus tenderness.   Pharynx:  Normal Neck:  Supple.  Enlarged posterior/lateral nodes are palpated bilaterally, tender to palpation on the left.   Lungs:   Bilateral expiratory wheezes.  Breath sounds are equal.  Moving air well. Heart:  Regular rate and rhythm without murmurs, rubs, or gallops.  Abdomen:  Nontender without masses or hepatosplenomegaly.  Bowel sounds are present.  No CVA or flank tenderness.  Extremities:  No edema.  Skin:  No rash present.    UC Treatments / Results  Labs (all labs ordered are listed, but only abnormal results are displayed) Labs Reviewed - No data to display  EKG  EKG Interpretation None       Radiology No results found.  Procedures Procedures (including critical care time)  Medications Ordered in UC Medications  methylPREDNISolone sodium succinate (SOLU-MEDROL) 125 mg/2 mL injection 80 mg (not administered)  ipratropium-albuterol (DUONEB) 0.5-2.5 (3) MG/3ML nebulizer solution 3 mL (3 mLs Nebulization Given 01/21/17 1718)     Initial Impression / Assessment and Plan / UC Course  I have reviewed the triage vital signs and the nursing notes.  Pertinent labs & imaging results that were available during my care of the patient were reviewed by me and considered in my medical decision making (see chart for details).    Administered DuoNeb by hand held nebulizer  Administered Solumedrol 80mg  IM. Begin Z-pak for atypical coverage. Prescription written for Benzonatate [facility](Tessalon) to take at bedtime for night-time cough.  Begin prednisone burst/taperTuesday 01/22/17. Take plain guaifenesin (1200mg  extended release tabs such as Mucinex) twice daily, with plenty of water, for cough and congestion.  May add  Pseudoephedrine (30mg , one or two every 4 to 6 hours) for sinus congestion.  Get adequate rest.   May use Afrin nasal spray (or generic oxymetazoline) each morning for about 5 days and then discontinue.  Also recommend using saline nasal spray several times daily and saline nasal irrigation (AYR is a common brand).  Use Flonase nasal spray each morning after using Afrin nasal spray and saline nasal irrigation. Try warm salt water gargles for sore throat.  Stop all antihistamines for now, and other  non-prescription cough/cold preparations. Followup with Family Doctor if not improved in one week.     Final Clinical Impressions(s) / UC Diagnoses   Final diagnoses:  Viral URI with cough  Bronchospasm, acute    ED Discharge Orders        Ordered    azithromycin (ZITHROMAX Z-PAK) 250 MG tablet     01/21/17 1744    predniSONE (DELTASONE) 20 MG tablet     01/21/17 1744    benzonatate (TESSALON) 200 MG capsule     01/21/17 1744           Lattie Haw, MD 01/23/17 1233

## 2017-04-14 ENCOUNTER — Other Ambulatory Visit: Payer: Self-pay | Admitting: Physician Assistant

## 2017-04-14 DIAGNOSIS — E038 Other specified hypothyroidism: Secondary | ICD-10-CM

## 2017-04-15 NOTE — Telephone Encounter (Signed)
Needs appt. And labs before May

## 2017-04-23 LAB — COMPLETE METABOLIC PANEL WITH GFR
AG Ratio: 1.4 (calc) (ref 1.0–2.5)
ALBUMIN MSPROF: 4.1 g/dL (ref 3.6–5.1)
ALT: 31 U/L — ABNORMAL HIGH (ref 6–29)
AST: 26 U/L (ref 10–30)
Alkaline phosphatase (APISO): 74 U/L (ref 33–115)
BUN: 12 mg/dL (ref 7–25)
CALCIUM: 9.1 mg/dL (ref 8.6–10.2)
CO2: 27 mmol/L (ref 20–32)
CREATININE: 0.62 mg/dL (ref 0.50–1.10)
Chloride: 104 mmol/L (ref 98–110)
GFR, EST NON AFRICAN AMERICAN: 112 mL/min/{1.73_m2} (ref >=60)
GFR, Est African American: 130 mL/min/{1.73_m2} (ref >=60)
GLOBULIN: 2.9 g/dL (ref 1.9–3.7)
Glucose, Bld: 114 mg/dL — ABNORMAL HIGH (ref 65–99)
Potassium: 4 mmol/L (ref 3.5–5.3)
SODIUM: 139 mmol/L (ref 135–146)
Total Bilirubin: 0.5 mg/dL (ref 0.2–1.2)
Total Protein: 7 g/dL (ref 6.1–8.1)

## 2017-04-23 LAB — LIPID PANEL W/REFLEX DIRECT LDL
CHOLESTEROL: 174 mg/dL (ref <200)
HDL: 38 mg/dL — ABNORMAL LOW (ref >50)
LDL Cholesterol (Calc): 108 mg/dL (calc) — ABNORMAL HIGH
Non-HDL Cholesterol (Calc): 136 mg/dL (calc) — ABNORMAL HIGH (ref <130)
Total CHOL/HDL Ratio: 4.6 (calc) (ref <5.0)
Triglycerides: 159 mg/dL — ABNORMAL HIGH (ref <150)

## 2017-04-23 LAB — TSH: TSH: 2.9 mIU/L

## 2017-04-23 NOTE — Progress Notes (Signed)
Call pt: Overall cholesterol looks pretty good. LDL goal is under 100. HDL could be higher. TG just a tad lower but really as long as NOT diabetic. Your fasting glucose was a little elevated. Next time you come in the office we need to get an A!C.  Kidney and liver look good. One liver just a little elevated. I will continue to monitor this.  Thyroid looks good. Do you need refills?

## 2017-04-24 ENCOUNTER — Encounter: Payer: Self-pay | Admitting: Physician Assistant

## 2017-06-16 ENCOUNTER — Other Ambulatory Visit: Payer: Self-pay | Admitting: Physician Assistant

## 2017-06-16 DIAGNOSIS — E038 Other specified hypothyroidism: Secondary | ICD-10-CM

## 2017-06-24 ENCOUNTER — Ambulatory Visit: Payer: BLUE CROSS/BLUE SHIELD | Admitting: Physician Assistant

## 2017-06-25 ENCOUNTER — Encounter: Payer: Self-pay | Admitting: Physician Assistant

## 2017-06-25 ENCOUNTER — Ambulatory Visit: Payer: BLUE CROSS/BLUE SHIELD | Admitting: Physician Assistant

## 2017-06-25 DIAGNOSIS — E039 Hypothyroidism, unspecified: Secondary | ICD-10-CM | POA: Diagnosis not present

## 2017-06-25 DIAGNOSIS — E119 Type 2 diabetes mellitus without complications: Secondary | ICD-10-CM

## 2017-06-25 DIAGNOSIS — Z6834 Body mass index (BMI) 34.0-34.9, adult: Secondary | ICD-10-CM | POA: Insufficient documentation

## 2017-06-25 DIAGNOSIS — Z6839 Body mass index (BMI) 39.0-39.9, adult: Secondary | ICD-10-CM

## 2017-06-25 DIAGNOSIS — R7301 Impaired fasting glucose: Secondary | ICD-10-CM | POA: Diagnosis not present

## 2017-06-25 DIAGNOSIS — I10 Essential (primary) hypertension: Secondary | ICD-10-CM

## 2017-06-25 DIAGNOSIS — E6609 Other obesity due to excess calories: Secondary | ICD-10-CM | POA: Insufficient documentation

## 2017-06-25 LAB — POCT GLYCOSYLATED HEMOGLOBIN (HGB A1C): Hemoglobin A1C: 7 % — AB (ref 4.0–5.6)

## 2017-06-25 MED ORDER — TOPIRAMATE 25 MG PO CPSP: 25.0000 mg | ORAL_CAPSULE | Freq: Two times a day (BID) | ORAL | 0 refills | Status: DC

## 2017-06-25 MED ORDER — EMPAGLIFLOZIN-METFORMIN HCL ER 10-1000 MG PO TB24: 1.0000 | ORAL_TABLET | Freq: Every day | ORAL | 2 refills | Status: DC

## 2017-06-25 MED ORDER — PHENTERMINE HCL 15 MG PO CAPS
15.0000 mg | ORAL_CAPSULE | ORAL | 0 refills | Status: DC
Start: 2017-06-25 — End: 2017-07-23

## 2017-06-25 NOTE — Progress Notes (Signed)
Subjective:    Patient ID: Tina Boone, female    DOB: Oct 07, 1975, 42 y.o.   MRN: [id number]  HPI  42 y/o female patient presents for follow-up on blood pressure, her weight, and elevated glucose reading.  Blood pressure- slightly elevated today at 130/90. Denies any CP, palpitations, headaches or vision changes.   Weight- she has not been able to make any lifestyle changes and has gained 12 lbs since her last visit 6 months ago. She admits she has not really been getting out of the house.   Elevated glucose reading- had a previously elevated glucose reading. She has not been watching what she eats and has not been limiting her carbs or sugar intake.  Hypothyroidism- labs just checked. Doing well.   .. Active Ambulatory Problems    Diagnosis Date Noted  . Morbid obesity with BMI of 45.0-49.9, adult (HCC) 01/23/2012  . Well adult exam 01/23/2012  . Contraception management 03/06/2013  . Elevated blood pressure 03/06/2013  . Essential hypertension, benign 04/01/2013  . Hypothyroidism 04/03/2013  . Hypertriglyceridemia without hypercholesterolemia 09/23/2015  . Adjustment disorder with mixed anxiety and depressed mood 06/22/2016  . Controlled type 2 diabetes mellitus without complication, without long-term current use of insulin (HCC) 06/25/2017  . Morbid obesity (HCC) 06/25/2017  . Elevated fasting glucose 06/25/2017   Resolved Ambulatory Problems    Diagnosis Date Noted  . No Resolved Ambulatory Problems   No Additional Past Medical History     Review of Systems  All other systems reviewed and are negative.      Objective:   Physical Exam  Constitutional: She is oriented to person, place, and time. She appears well-developed and well-nourished.  HENT:  Head: Normocephalic and atraumatic.  Eyes: Pupils are equal, round, and reactive to light. EOM are normal.  Cardiovascular: Normal rate, regular rhythm and normal heart sounds.  Pulmonary/Chest: Effort normal  and breath sounds normal.  Neurological: She is alert and oriented to person, place, and time.  Skin: Skin is warm and dry.  Psychiatric: She has a normal mood and affect. Her behavior is normal. Thought content normal.  Vitals reviewed. .. Vitals:   06/25/17 1109 06/25/17 1143  BP: (!) 141/83 130/90  Pulse: 79        Assessment & Plan:  Marland KitchenMarland KitchenPorsche was seen today for hypothyroidism.  Diagnoses and all orders for this visit:  Morbid obesity (HCC) -     phentermine 15 MG capsule; Take 1 capsule (15 mg total) by mouth every morning. -     topiramate (TOPAMAX) 25 MG capsule; Take 1 capsule (25 mg total) by mouth 2 (two) times daily.  Elevated fasting glucose -     POCT HgB A1C  Hypothyroidism, unspecified type  Essential hypertension, benign  Controlled type 2 diabetes mellitus without complication, without long-term current use of insulin (HCC) -     Empagliflozin-metFORMIN HCl ER (SYNJARDY XR) 11-998 MG TB24; Take 1 tablet by mouth daily.  .. Depression screen [facility] 2/9 06/25/2017 12/24/2016 03/06/2013  Decreased Interest 0 0 0  Down, Depressed, Hopeless 0 0 0  PHQ - 2 Score 0 0 0     Patient's hgb a1c today was 7.0. Encouraged her that if she would lose some weight, that it would likely get better. Will start on medication Kirk Ruths) for diabetes today. Discussed side effects.  Advised that she try to limit her carbohydrate and sugar intake as much as she can. Increase exercise to walking a few times a week.  Spent  some time discussing DM cause and effect.  Declined DM nutrition counseling.  Will hold on ACE/STATIN/Pneumonia vaccine discussion until next visit.  Follow up in 3 months.   Obesity- will start her on Topamax and low-dose phentermine. Discussed side effects. Follow up in 1 month.   Hypothyroidism-controlled with refills sent.

## 2017-07-23 ENCOUNTER — Ambulatory Visit (INDEPENDENT_AMBULATORY_CARE_PROVIDER_SITE_OTHER): Payer: BLUE CROSS/BLUE SHIELD | Admitting: Physician Assistant

## 2017-07-23 VITALS — BP 146/76 | HR 94 | Ht 67.0 in | Wt 297.0 lb

## 2017-07-23 DIAGNOSIS — R030 Elevated blood-pressure reading, without diagnosis of hypertension: Secondary | ICD-10-CM

## 2017-07-23 MED ORDER — PHENTERMINE HCL 15 MG PO CAPS: 15.0000 mg | ORAL_CAPSULE | ORAL | 0 refills | Status: DC
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
## 2017-07-26 ENCOUNTER — Other Ambulatory Visit: Payer: Self-pay | Admitting: Physician Assistant

## 2017-08-23 ENCOUNTER — Ambulatory Visit (INDEPENDENT_AMBULATORY_CARE_PROVIDER_SITE_OTHER): Payer: BLUE CROSS/BLUE SHIELD | Admitting: Physician Assistant

## 2017-08-23 ENCOUNTER — Encounter: Payer: Self-pay | Admitting: Physician Assistant

## 2017-08-23 MED ORDER — TOPIRAMATE 25 MG PO CPSP: 25.0000 mg | ORAL_CAPSULE | Freq: Two times a day (BID) | ORAL | 2 refills | Status: DC

## 2017-08-23 MED ORDER — PHENTERMINE HCL 15 MG PO CAPS: 15.0000 mg | ORAL_CAPSULE | ORAL | 2 refills | Status: DC

## 2017-08-23 NOTE — Progress Notes (Signed)
   Subjective:    Patient ID: Thersa SaltChristina Demore, female    DOB: March 12, 1975, 42 y.o.   MRN: [id number]  HPI Patient is a 42 year old morbidly obese female with T2DM who presents to the clinic to follow-up on weight.  1 month ago she was started on phentermine 15 mg and Topamax 25 mg twice a day.  She is tolerated this medication very well.  She denies any insomnia, anxiety, palpitations, numbness or tingling or brain fog.  She has lost 14 pounds in 1 month.  She has noticed a curb in appetite.  She is trying to stay active but admits to no exercise regimen.  As she is losing weight she already feels better.  .. Active Ambulatory Problems    Diagnosis Date Noted  . Morbid obesity with BMI of 45.0-49.9, adult (HCC) 01/23/2012  . Well adult exam 01/23/2012  . Contraception management 03/06/2013  . Elevated blood pressure 03/06/2013  . Essential hypertension, benign 04/01/2013  . Hypothyroidism 04/03/2013  . Hypertriglyceridemia without hypercholesterolemia 09/23/2015  . Adjustment disorder with mixed anxiety and depressed mood 06/22/2016  . Controlled type 2 diabetes mellitus without complication, without long-term current use of insulin (HCC) 06/25/2017  . Morbid obesity (HCC) 06/25/2017  . Elevated fasting glucose 06/25/2017   Resolved Ambulatory Problems    Diagnosis Date Noted  . No Resolved Ambulatory Problems   No Additional Past Medical History      Review of Systems See HPI.     Objective:   Physical Exam  Constitutional: She is oriented to person, place, and time. She appears well-developed and well-nourished.  HENT:  Head: Normocephalic and atraumatic.  Cardiovascular: Normal rate and regular rhythm.  Pulmonary/Chest: Effort normal and breath sounds normal.  Neurological: She is alert and oriented to person, place, and time.  Psychiatric: She has a normal mood and affect. Her behavior is normal.          Assessment & Plan:  Marland Kitchen.Marland Kitchen.Diagnoses and all orders for this  visit:  Morbid obesity (HCC) -     phentermine 15 MG capsule; Take 1 capsule (15 mg total) by mouth every morning. -     topiramate (TOPAMAX) 25 MG capsule; Take 1 capsule (25 mg total) by mouth 2 (two) times daily.   Marland Kitchen..Discussed low carb diet with 1500 calories and 80g of protein.  Exercising at least 150 minutes a week.  My Fitness Pal could be a [employer] resource.  Continue phentermine and topamax. Follow up on DM recheck schedule.

## 2017-08-28 ENCOUNTER — Other Ambulatory Visit: Payer: Self-pay | Admitting: Physician Assistant

## 2017-09-13 ENCOUNTER — Other Ambulatory Visit: Payer: Self-pay | Admitting: Physician Assistant

## 2017-09-13 DIAGNOSIS — E119 Type 2 diabetes mellitus without complications: Secondary | ICD-10-CM

## 2017-09-30 ENCOUNTER — Ambulatory Visit (INDEPENDENT_AMBULATORY_CARE_PROVIDER_SITE_OTHER): Payer: BLUE CROSS/BLUE SHIELD | Admitting: Physician Assistant

## 2017-09-30 ENCOUNTER — Encounter: Payer: Self-pay | Admitting: Physician Assistant

## 2017-09-30 VITALS — BP 125/67 | HR 78 | Ht 67.0 in | Wt 273.0 lb

## 2017-09-30 DIAGNOSIS — Z23 Encounter for immunization: Secondary | ICD-10-CM

## 2017-09-30 DIAGNOSIS — R030 Elevated blood-pressure reading, without diagnosis of hypertension: Secondary | ICD-10-CM

## 2017-09-30 NOTE — Progress Notes (Signed)
   Subjective:    Patient ID: Tina Boone, female    DOB: [birth year], 42 y.o.   MRN: [id number]  HPI  Pt is a 42 yo morbidly obese female who presents to the clinic for follow up on blood pressure and weight loss. Last visit her BP was elevated. She is on phentermine 15mg  and topamax 25mg  bid. She is doing great. No CP, palpitations, insomnia. She has lost another 10lbs this month. She is walking some but more portion size control and drinking more water. Not checking BP at home but has been changing diet to help with BP.   .. Active Ambulatory Problems    Diagnosis Date Noted  . Morbid obesity with BMI of 45.0-49.9, adult (HCC) 01/23/2012  . Well adult exam 01/23/2012  . Contraception management 03/06/2013  . Elevated blood pressure 03/06/2013  . Essential hypertension, benign 04/01/2013  . Hypothyroidism 04/03/2013  . Hypertriglyceridemia without hypercholesterolemia 09/23/2015  . Adjustment disorder with mixed anxiety and depressed mood 06/22/2016  . Controlled type 2 diabetes mellitus without complication, without long-term current use of insulin (HCC) 06/25/2017  . Morbid obesity (HCC) 06/25/2017  . Elevated fasting glucose 06/25/2017   Resolved Ambulatory Problems    Diagnosis Date Noted  . No Resolved Ambulatory Problems   No Additional Past Medical History        Review of Systems  All other systems reviewed and are negative.      Objective:   Physical Exam  Constitutional: She is oriented to person, place, and time. She appears well-developed and well-nourished.  HENT:  Head: Normocephalic and atraumatic.  Cardiovascular: Normal rate and regular rhythm.  Pulmonary/Chest: Effort normal and breath sounds normal.  Neurological: She is alert and oriented to person, place, and time.  Psychiatric: She has a normal mood and affect. Her behavior is normal.          Assessment & Plan:  Marland Kitchen.Marland Kitchen.Diagnoses and all orders for this visit:  Elevated blood pressure  reading  Morbid obesity (HCC)  Need for immunization against influenza -     Flu Vaccine QUAD 36+ mos IM   BP looks great today. Continue with phentermine and topamax. Discussed 150 minutes of exercise a week. Goal is to lose 20 lbs in the next 3 month.

## 2017-10-15 ENCOUNTER — Other Ambulatory Visit: Payer: Self-pay | Admitting: Physician Assistant

## 2017-10-15 DIAGNOSIS — E119 Type 2 diabetes mellitus without complications: Secondary | ICD-10-CM

## 2017-11-04 ENCOUNTER — Other Ambulatory Visit: Payer: Self-pay | Admitting: Physician Assistant

## 2017-11-18 ENCOUNTER — Other Ambulatory Visit: Payer: Self-pay | Admitting: Physician Assistant

## 2017-11-19 ENCOUNTER — Other Ambulatory Visit: Payer: Self-pay | Admitting: Physician Assistant

## 2017-11-19 DIAGNOSIS — E119 Type 2 diabetes mellitus without complications: Secondary | ICD-10-CM

## 2017-11-27 ENCOUNTER — Other Ambulatory Visit: Payer: Self-pay | Admitting: Physician Assistant

## 2017-11-30 ENCOUNTER — Other Ambulatory Visit: Payer: Self-pay | Admitting: Physician Assistant

## 2017-12-02 ENCOUNTER — Encounter: Payer: Self-pay | Admitting: Physician Assistant

## 2017-12-10 ENCOUNTER — Encounter: Payer: Self-pay | Admitting: Physician Assistant

## 2017-12-10 ENCOUNTER — Ambulatory Visit (INDEPENDENT_AMBULATORY_CARE_PROVIDER_SITE_OTHER): Payer: BLUE CROSS/BLUE SHIELD | Admitting: Physician Assistant

## 2017-12-10 ENCOUNTER — Ambulatory Visit (INDEPENDENT_AMBULATORY_CARE_PROVIDER_SITE_OTHER): Payer: BLUE CROSS/BLUE SHIELD

## 2017-12-10 VITALS — BP 126/79 | HR 91 | Ht 67.0 in | Wt 260.0 lb

## 2017-12-10 DIAGNOSIS — E119 Type 2 diabetes mellitus without complications: Secondary | ICD-10-CM | POA: Diagnosis not present

## 2017-12-10 DIAGNOSIS — W19XXXA Unspecified fall, initial encounter: Secondary | ICD-10-CM | POA: Diagnosis not present

## 2017-12-10 DIAGNOSIS — M1712 Unilateral primary osteoarthritis, left knee: Secondary | ICD-10-CM

## 2017-12-10 DIAGNOSIS — M25562 Pain in left knee: Secondary | ICD-10-CM

## 2017-12-10 DIAGNOSIS — E7800 Pure hypercholesterolemia, unspecified: Secondary | ICD-10-CM

## 2017-12-10 DIAGNOSIS — I10 Essential (primary) hypertension: Secondary | ICD-10-CM

## 2017-12-10 DIAGNOSIS — M2392 Unspecified internal derangement of left knee: Secondary | ICD-10-CM

## 2017-12-10 DIAGNOSIS — M170 Bilateral primary osteoarthritis of knee: Secondary | ICD-10-CM | POA: Insufficient documentation

## 2017-12-10 LAB — POCT GLYCOSYLATED HEMOGLOBIN (HGB A1C): Hemoglobin A1C: 5.3 % (ref 4.0–5.6)

## 2017-12-10 MED ORDER — MELOXICAM 15 MG PO TABS: 15.0000 mg | ORAL_TABLET | Freq: Every day | ORAL | 1 refills | Status: DC

## 2017-12-10 MED ORDER — HYDROCODONE-ACETAMINOPHEN 5-325 MG PO TABS: 1.0000 | ORAL_TABLET | Freq: Three times a day (TID) | ORAL | 0 refills | Status: DC | PRN

## 2017-12-10 MED ORDER — PHENTERMINE HCL 15 MG PO CAPS: 15.0000 mg | ORAL_CAPSULE | ORAL | 0 refills | Status: DC

## 2017-12-10 NOTE — Progress Notes (Signed)
Subjective:    Patient ID: Tina Boone, female    DOB: March 31, 1975, 42 y.o.   MRN: [id number]  HPI  Pt is a 42 yo morbidly female with T2DM, HTN who presents to the clinic with acute left knee pain due to fall 2 days ago. She recently got a job with [employer] and slipped on a wet leaf on someones porch going down the stairs. She landed with pretty direct impact on left knee. She does not remembering hearing any pop. She does not remember if she twisted her knee. She is in a lot of pain with any movement. She cannot fully flex or extend knee. She is having problems putting weight on that knee. She worked yesterday all day with it.  She has lost another 13lbs since 09/2017. She is very excited about this job and the opportunity to walk so much. She has told her supervisor but not much was said.  DM- not checking sugars. Changed a lot about diet. Has been a lot more active. Taking synjardy.   .. Active Ambulatory Problems    Diagnosis Date Noted  . Morbid obesity with BMI of 45.0-49.9, adult (HCC) 01/23/2012  . Contraception management 03/06/2013  . Elevated blood pressure 03/06/2013  . Essential hypertension, benign 04/01/2013  . Hypothyroidism 04/03/2013  . Hypertriglyceridemia without hypercholesterolemia 09/23/2015  . Adjustment disorder with mixed anxiety and depressed mood 06/22/2016  . Controlled type 2 diabetes mellitus without complication, without long-term current use of insulin (HCC) 06/25/2017  . Morbid obesity (HCC) 06/25/2017  . Elevated fasting glucose 06/25/2017  . Internal derangement of left knee 12/10/2017   Resolved Ambulatory Problems    Diagnosis Date Noted  . Well adult exam 01/23/2012   No Additional Past Medical History      Review of Systems See HPI.     Objective:   Physical Exam  Constitutional: She is oriented to person, place, and time. She appears well-developed and well-nourished.  Obese.   HENT:  Head: Normocephalic and atraumatic.   Cardiovascular: Normal rate and regular rhythm.  Pulmonary/Chest: Effort normal and breath sounds normal.  Musculoskeletal:  Left knee:  Decreased ROM. Pt is not able to fully flex or extended knee. Pain with ROM.  Bruised with effusion present.    Neurological: She is alert and oriented to person, place, and time.  Psychiatric: She has a normal mood and affect. Her behavior is normal.          Assessment & Plan:  Marland KitchenMarland KitchenDiagnoses and all orders for this visit:  Acute pain of left knee -     DG Knee 4 Views W/Patella Left -     meloxicam (MOBIC) 15 MG tablet; Take 1 tablet (15 mg total) by mouth daily. -     MR KNEE LEFT WO CONTRAST; Future  Fall, initial encounter -     DG Knee 4 Views W/Patella Left  Controlled type 2 diabetes mellitus without complication, without long-term current use of insulin (HCC) -     POCT glycosylated hemoglobin (Hb A1C)  Morbid obesity (HCC) -     phentermine 15 MG capsule; Take 1 capsule (15 mg total) by mouth every morning.  Internal derangement of left knee -     meloxicam (MOBIC) 15 MG tablet; Take 1 tablet (15 mg total) by mouth daily. -     MR KNEE LEFT WO CONTRAST; Future -     HYDROcodone-acetaminophen (NORCO/VICODIN) 5-325 MG tablet; Take 1 tablet by mouth every 8 (eight) hours as needed  for moderate pain.   STAT xray showed a loose body in the central joint with arthritis and some effusion.   Consulted Dr. Karie Schwalbe. See his note.  Given mobic to take once a day. Continue to ice.  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  Discussed work note. She is off the next 2 days. She really needs this job. She hopes she can get MRI and then better understand how long if any she needs to be out.  I did suggest decrease hours worked to 6 instead of 10-12 hours a day.   .. Results for orders placed or performed in visit on 12/10/17  POCT glycosylated hemoglobin (Hb A1C)  Result Value Ref Range   Hemoglobin A1C 5.3 4.0 - 5.6 %   HbA1c POC (<> result, manual entry)      HbA1c, POC (prediabetic range)     HbA1c, POC (controlled diabetic range)     A!C looks great.  Stay on same medications.  Refilled phentermine and topamax for weight loss.  lipitor to start.  Needs eye exam.  Will get micro at next visit.  BP looks great.

## 2017-12-10 NOTE — Progress Notes (Signed)
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  Pain is continued, she was able to minimally bear weight.  Swelling was immediate.  Pain is severe.  I reviewed the past medical history, family history, social history, surgical history, and allergies today and no changes were needed.  Please see the problem list section below in epic for further details.  Past Medical History: No past medical history on file. Past Surgical History: Past Surgical History:  Procedure Laterality Date  . CHOLECYSTECTOMY  2009  . TONSILECTOMY/ADENOIDECTOMY WITH MYRINGOTOMY  12/2010   Social History: Social History   Socioeconomic History  . Marital status: Single    Spouse name: Not on file  . Number of children: 2  . Years of education: some colle  . Highest education level: Not on file  Occupational History  . 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 Financial resource strain: Not on file  . Food insecurity:    Worry: Not on file    Inability: Not on file  . Transportation needs:    Medical: Not on file    Non-medical: Not on file  Tobacco Use  . Smoking status: Never Smoker  . Smokeless tobacco: Never Used  Substance and Sexual Activity  . Alcohol use: No  . Drug use: No  . Sexual activity: Not Currently    Birth control/protection: Injection    Comment: Depo  Lifestyle  . Physical activity:    Days per week: Not on file    Minutes per session: Not on file  . Stress: Not on file  Relationships  . Social connections:    Talks on phone: Not on file    Gets together: Not on file    Attends religious service: Not on file    Active member of club or organization: Not on file   Attends meetings of clubs or organizations: Not on file    Relationship status: Not on file  Other Topics Concern  . Not on file  Social History Narrative   Lives with 41 yo daughter and 58 yo son.    Family History: Family History  Problem Relation Age of Onset  . Heart disease Father   . Stroke Brother   . Cancer Paternal Aunt        leukemia  . Cancer Paternal Grandmother 36       breast  . Stroke Paternal Grandfather   . Cancer Mother        pancreatic   Allergies: No Known Allergies Medications: See med rec.  Review of Systems: No headache, visual changes, nausea, vomiting, diarrhea, constipation, dizziness, abdominal pain, skin rash, fevers, chills, night sweats, weight loss, swollen lymph nodes, body aches, joint swelling, muscle aches, chest pain, shortness of breath, mood changes, visual or auditory hallucinations.   Objective:   General: Well Developed, well nourished, and in no acute distress.  Neuro:  Extra-ocular muscles intact, able to move all 4 extremities, sensation grossly intact.  Deep tendon reflexes tested were normal. Psych: Alert and oriented, mood congruent with affect. ENT:  Ears and nose appear unremarkable.  Hearing grossly normal. Neck: Unremarkable overall appearance, trachea midline.  No visible  thyroid enlargement. Eyes: Conjunctivae and lids appear unremarkable.  Pupils equal and round. Skin: Warm and dry, no rashes noted.  Cardiovascular: Pulses palpable, no extremity edema. Left knee: Swollen, bruised, palpable effusion. Lax to valgus stress ROM normal in flexion and extension and lower leg rotation. Ligaments with solid consistent endpoints including ACL, PCL, LCL. Negative Mcmurray's and provocative meniscal tests. Non painful patellar compression. Patellar and quadriceps tendons unremarkable. Hamstring and quadriceps strength is normal.  Impression and Recommendations:   This case required medical decision making of moderate  complexity.  Internal derangement of left knee Locked with about 10 degrees of extension lag, MCL laxity, effusion after trauma. Suspect bucket-handle meniscal tear +/- MCL tear. Osteoarthritis with some intra-articular loose bodies and osteoarthritis, ordering an MRI. Knee immobilizer. Meloxicam and hydrocodone for breakthrough pain. Return to see me for MRI results. ___________________________________________ Ihor Austin. Benjamin Stain, M.D., ABFM., CAQSM. Primary Care and Sports Medicine [facility]  Adjunct Professor of Family Medicine  [employer]

## 2017-12-10 NOTE — Assessment & Plan Note (Signed)
Locked with about 10 degrees of extension lag, MCL laxity, effusion after trauma. Suspect bucket-handle meniscal tear +/- MCL tear. Osteoarthritis with some intra-articular loose bodies and osteoarthritis, ordering an MRI. Knee immobilizer. Meloxicam and hydrocodone for breakthrough pain. Return to see me for MRI results.

## 2017-12-11 ENCOUNTER — Ambulatory Visit (INDEPENDENT_AMBULATORY_CARE_PROVIDER_SITE_OTHER): Payer: BLUE CROSS/BLUE SHIELD | Admitting: Physician Assistant

## 2017-12-11 ENCOUNTER — Encounter: Payer: Self-pay | Admitting: Physician Assistant

## 2017-12-11 DIAGNOSIS — M25562 Pain in left knee: Secondary | ICD-10-CM

## 2017-12-11 MED ORDER — ATORVASTATIN CALCIUM 10 MG PO TABS: 10.0000 mg | ORAL_TABLET | Freq: Every day | ORAL | 3 refills | Status: DC

## 2017-12-11 NOTE — Telephone Encounter (Signed)
Please explain to her we certainly can do that but statin are indicated now for anyone with diabetes really no matter her LDL due to the CV benefit.

## 2017-12-11 NOTE — Telephone Encounter (Signed)
Pt advised. She will come in today for knee immobilizer.   She wants her lipid panel rechecked before starting Lipitor. Lab order placed.

## 2017-12-11 NOTE — Telephone Encounter (Signed)
Can we call patient and let her know I sent lipitor to pharmacy due to having DM as a risk reduction. It is a small dose but needed with her risk factors.   Also Dr.T gave [place] the immoblizer. Have her bring it in under nurse visit and lets figure it out.

## 2017-12-11 NOTE — Progress Notes (Signed)
Pt in office today to check her brace that was given yesterday. Upon inspection wrong type of brace given to patient. Correct brace was located and placed on patients left knee. Paperwork for braces was also corrected.

## 2017-12-12 ENCOUNTER — Other Ambulatory Visit: Payer: Self-pay | Admitting: Physician Assistant

## 2017-12-12 DIAGNOSIS — E038 Other specified hypothyroidism: Secondary | ICD-10-CM

## 2017-12-12 LAB — LIPID PANEL
CHOL/HDL RATIO: 4.3 (calc) (ref <5.0)
Cholesterol: 162 mg/dL (ref <200)
HDL: 38 mg/dL — AB (ref >50)
LDL CHOLESTEROL (CALC): 101 mg/dL — AB
NON-HDL CHOLESTEROL (CALC): 124 mg/dL (ref <130)
TRIGLYCERIDES: 132 mg/dL (ref <150)

## 2017-12-12 NOTE — Telephone Encounter (Signed)
Call pt: LDL is slightly better. TG are normal. HDL stable. Goal for DM is LDL under 70 and guidelines are to be on a statin to reduce cardiovascular risk in any situation. It is your choice but I do want you to know guidelines.

## 2017-12-16 ENCOUNTER — Ambulatory Visit (HOSPITAL_COMMUNITY)
Admission: RE | Admit: 2017-12-16 | Discharge: 2017-12-16 | Disposition: A | Discharge status: Home / Self Care | Payer: BLUE CROSS/BLUE SHIELD | Source: Ambulatory Visit | Attending: Sports Medicine | Admitting: Sports Medicine

## 2017-12-16 ENCOUNTER — Ambulatory Visit (HOSPITAL_COMMUNITY): Admission: RE | Admit: 2017-12-16 | Payer: BLUE CROSS/BLUE SHIELD | Source: Ambulatory Visit

## 2017-12-16 DIAGNOSIS — M2392 Unspecified internal derangement of left knee: Secondary | ICD-10-CM | POA: Insufficient documentation

## 2017-12-16 DIAGNOSIS — M25562 Pain in left knee: Secondary | ICD-10-CM | POA: Insufficient documentation

## 2017-12-16 DIAGNOSIS — M948X6 Other specified disorders of cartilage, lower leg: Secondary | ICD-10-CM | POA: Insufficient documentation

## 2017-12-19 ENCOUNTER — Telehealth: Payer: Self-pay

## 2017-12-19 ENCOUNTER — Encounter: Payer: Self-pay | Admitting: Sports Medicine

## 2017-12-19 ENCOUNTER — Ambulatory Visit (INDEPENDENT_AMBULATORY_CARE_PROVIDER_SITE_OTHER): Payer: BLUE CROSS/BLUE SHIELD | Admitting: Sports Medicine

## 2017-12-19 ENCOUNTER — Encounter: Payer: Self-pay | Admitting: Physician Assistant

## 2017-12-19 DIAGNOSIS — M1712 Unilateral primary osteoarthritis, left knee: Secondary | ICD-10-CM

## 2017-12-19 NOTE — Assessment & Plan Note (Signed)
MRI showed run-of-the-mill osteoarthritis. Aspiration and injection, rehab exercises given, return to see me in 1 month.

## 2017-12-19 NOTE — Telephone Encounter (Signed)
Pt called wanting appt today- advised her no openings. Offered appt tomorrow or Monday and pt declined, stating she wanted appt today. Pt states she will just call back and scheduled next week.

## 2017-12-19 NOTE — Progress Notes (Signed)
Subjective:    CC: Follow-up  HPI: Tina Boone returns, she has persistent left knee pain, moderate, persistent, medial joint line without radiation, MRI simply showed osteoarthritis.  I reviewed the past medical history, family history, social history, surgical history, and allergies today and no changes were needed.  Please see the problem list section below in epic for further details.  Past Medical History: No past medical history on file. Past Surgical History: Past Surgical History:  Procedure Laterality Date  . CHOLECYSTECTOMY  2009  . TONSILECTOMY/ADENOIDECTOMY WITH MYRINGOTOMY  12/2010   Social History: Social History   Socioeconomic History  . Marital status: Divorced    Spouse name: Not on file  . Number of children: 2  . Years of education: some colle  . Highest education level: Not on file  Occupational History  . 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 Financial resource strain: Not on file  . Food insecurity:    Worry: Not on file    Inability: Not on file  . Transportation needs:    Medical: Not on file    Non-medical: Not on file  Tobacco Use  . Smoking status: Never Smoker  . Smokeless tobacco: Never Used  Substance and Sexual Activity  . Alcohol use: No  . Drug use: No  . Sexual activity: Not Currently    Birth control/protection: Injection    Comment: Depo  Lifestyle  . Physical activity:    Days per week: Not on file    Minutes per session: Not on file  . Stress: Not on file  Relationships  . Social connections:    Talks on phone: Not on file    Gets together: Not on file    Attends religious service: Not on file    Active member of club or organization: Not on file    Attends meetings of clubs or organizations: Not on file    Relationship status: Not on file  Other Topics Concern  . Not on file  Social History Narrative   Lives with 73 yo  daughter and 55 yo son.    Family History: Family History  Problem Relation Age of Onset  . Heart disease Father   . Stroke Brother   . Cancer Paternal Aunt        leukemia  . Cancer Paternal Grandmother 30       breast  . Stroke Paternal Grandfather   . Cancer Mother        pancreatic   Allergies: No Known Allergies Medications: See med rec.  Review of Systems: No fevers, chills, night sweats, weight loss, chest pain, or shortness of breath.   Objective:    General: Well Developed, well nourished, and in no acute distress.  Neuro: Alert and oriented x3, extra-ocular muscles intact, sensation grossly intact.  HEENT: Normocephalic, atraumatic, pupils equal round reactive to light, neck supple, no masses, no lymphadenopathy, thyroid nonpalpable.  Skin: Warm and dry, no rashes. Cardiac: Regular rate and rhythm, no murmurs rubs or gallops, no lower extremity edema.  Respiratory: Clear to auscultation bilaterally. Not using accessory muscles, speaking in full sentences. Left Knee:  Visibly swollen, palpable fluid wave with effusion. ROM normal in flexion and extension and lower leg rotation. Ligaments with solid consistent endpoints including ACL, PCL, LCL, MCL. Negative Mcmurray's and provocative meniscal tests. Non painful patellar compression. Patellar and quadriceps tendons unremarkable. Hamstring  and quadriceps strength is normal.  Procedure: Real-time Ultrasound Guided aspiration/injection of left knee Device: GE Logiq E  Verbal informed consent obtained.  Time-out conducted.  Noted no overlying erythema, induration, or other signs of local infection.  Skin prepped in a sterile fashion.  Local anesthesia: Topical Ethyl chloride.  With sterile technique and under real time ultrasound guidance: Using an 18-gauge needle aspirated approximately 6 cc of clear, straw-colored fluid, syringe switched and 1 cc kenalog 40, 2 cc lidocaine, 2 cc bupivacaine injected  easily. Completed without difficulty  Pain immediately resolved suggesting accurate placement of the medication.  Advised to call if fevers/chills, erythema, induration, drainage, or persistent bleeding.  Images permanently stored and available for review in the ultrasound unit.  Impression: Technically successful ultrasound guided injection.  Impression and Recommendations:    Primary osteoarthritis of left knee MRI showed run-of-the-mill osteoarthritis. Aspiration and injection, rehab exercises given, return to see me in 1 month. ___________________________________________ Ihor Austinhomas J. Benjamin Stainhekkekandam, M.D., ABFM., CAQSM. Primary Care and Sports Medicine [facility]  Adjunct Professor of Family Medicine  [employer]

## 2017-12-19 NOTE — Telephone Encounter (Signed)
Patient advised and scheduled.  

## 2017-12-19 NOTE — Telephone Encounter (Signed)
The result notes were reviewed and sent to Amber on the 11th, and the patient was noted to have reviewed results themselves on the 12th.  It showed plain osteoarthritis, she should come in for an injection.

## 2017-12-19 NOTE — Telephone Encounter (Signed)
Tina OreChristina called about her MRI results. She left a message asking what is the plan for the knee pain. Please advise.

## 2017-12-20 ENCOUNTER — Other Ambulatory Visit: Payer: Self-pay | Admitting: Family Medicine

## 2017-12-20 DIAGNOSIS — E038 Other specified hypothyroidism: Secondary | ICD-10-CM

## 2017-12-20 NOTE — Progress Notes (Signed)
Future labs have been ordered and patient is aware to come [facility] to labs [facility] has changed on Levothyroxine. No further questions at this time.

## 2017-12-31 ENCOUNTER — Encounter: Payer: Self-pay | Admitting: Physician Assistant

## 2017-12-31 ENCOUNTER — Ambulatory Visit (INDEPENDENT_AMBULATORY_CARE_PROVIDER_SITE_OTHER): Payer: BLUE CROSS/BLUE SHIELD | Admitting: Physician Assistant

## 2017-12-31 VITALS — BP 129/79 | HR 89 | Temp 98.2°F | Wt 252.0 lb

## 2017-12-31 DIAGNOSIS — E119 Type 2 diabetes mellitus without complications: Secondary | ICD-10-CM

## 2017-12-31 DIAGNOSIS — I10 Essential (primary) hypertension: Secondary | ICD-10-CM

## 2017-12-31 DIAGNOSIS — E039 Hypothyroidism, unspecified: Secondary | ICD-10-CM | POA: Diagnosis not present

## 2017-12-31 DIAGNOSIS — Z6839 Body mass index (BMI) 39.0-39.9, adult: Secondary | ICD-10-CM

## 2017-12-31 NOTE — Progress Notes (Signed)
Subjective:    Patient ID: Tina Boone, female    DOB: [birth year], 42 y.o.   MRN: [id number]  HPI  Patient is a 42 year old female with type 2 diabetes, hypertension, hypothyroidism, obesity who presents to the clinic for 41-month follow-up.  She was recently seen after a left knee injury.  She is also seeing Dr. Karie Schwalbe for this injury.  He was able to inject her knee with cortisone she is doing much better.  She does wear a knee sleeve for support.  She also uses some of 800 mg ibuprofen as needed.   She continues to lose weight.  She has lost 14 pounds in 2 weeks.  She is on phentermine and Topamax.  She is walking a lot with her new job at [employer].  She is busy during the Thanksgiving Christmas season working 12 to 14 hours a day.  She is also watching her diet and on Synjardy.  She recently had her A1c checked.  We were alerted by the pharmacy that her Synthroid had recently changed many factors.  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  She does not notice any differences.    .. Active Ambulatory Problems    Diagnosis Date Noted  . Contraception management 03/06/2013  . Elevated blood pressure 03/06/2013  . Essential hypertension, benign 04/01/2013  . Hypothyroidism 04/03/2013  . Hypertriglyceridemia without hypercholesterolemia 09/23/2015  . Adjustment disorder with mixed anxiety and depressed mood 06/22/2016  . Controlled type 2 diabetes mellitus without complication, without long-term current use of insulin (HCC) 06/25/2017  . Class 2 severe obesity due to excess calories with serious comorbidity and body mass index (BMI) of 39.0 to 39.9 in adult (HCC) 06/25/2017  . Elevated fasting glucose 06/25/2017  . Primary osteoarthritis of left knee 12/10/2017   Resolved Ambulatory Problems    Diagnosis Date Noted  . Morbid obesity with BMI of 45.0-49.9, adult (HCC) 01/23/2012  . Well adult exam 01/23/2012   No Additional Past Medical History      Review of  Systems See HPI.     Objective:   Physical Exam  Constitutional: She is oriented to person, place, and time. She appears well-developed and well-nourished.  HENT:  Head: Normocephalic and atraumatic.  Cardiovascular: Normal rate and regular rhythm.  Pulmonary/Chest: Effort normal and breath sounds normal.  Neurological: She is alert and oriented to person, place, and time.  Psychiatric: She has a normal mood and affect. Her behavior is normal.          Assessment & Plan:  Marland KitchenMarland KitchenDiagnoses and all orders for this visit:  Controlled type 2 diabetes mellitus without complication, without long-term current use of insulin (HCC)  Essential hypertension, benign  Hypothyroidism, unspecified type  Class 2 severe obesity due to excess calories with serious comorbidity and body mass index (BMI) of 39.0 to 39.9 in adult [facility])   Patient does not need refills at this time.  Her main factor for her Synthroid was just recently changed.  She has an order slip to have drawn in the next 6 weeks.  We will make changes as needed.  Her A1c was checked 3 weeks ago and great at 5.3. Next visit will get pneumonia/foot exam/micro.  Encouraged patient to get eye exam.   Patient continues to lose weight.  She has lost 14 pounds in the last 2 weeks.  She is working for [employer] and walking a lot.  Patient's blood pressure looks great today.  Overall she is doing very well she  can follow-up in 3 months.  We did set a weight loss goal of 12 pounds in the next 3 months.

## 2018-01-09 ENCOUNTER — Other Ambulatory Visit: Payer: Self-pay

## 2018-01-09 DIAGNOSIS — E119 Type 2 diabetes mellitus without complications: Secondary | ICD-10-CM

## 2018-01-09 MED ORDER — EMPAGLIFLOZIN-METFORMIN HCL ER 10-1000 MG PO TB24: 1.0000 | ORAL_TABLET | Freq: Every day | ORAL | 1 refills | Status: DC

## 2018-01-15 ENCOUNTER — Encounter: Payer: Self-pay | Admitting: Sports Medicine

## 2018-01-15 ENCOUNTER — Ambulatory Visit (INDEPENDENT_AMBULATORY_CARE_PROVIDER_SITE_OTHER): Payer: BLUE CROSS/BLUE SHIELD | Admitting: Sports Medicine

## 2018-01-15 DIAGNOSIS — M170 Bilateral primary osteoarthritis of knee: Secondary | ICD-10-CM

## 2018-01-15 NOTE — Assessment & Plan Note (Signed)
Moderate response to injection in the left knee. She still has some discomfort, but more importantly she has similar discomfort in her right knee and desires injection today. I am going to get her approved for Visco supplementation, she is going to look into it, she can come in for injections when approved. The MRI did show run-of-the-mill osteoarthritis without much in line of meniscal tearing.

## 2018-01-15 NOTE — Progress Notes (Signed)
Subjective:    CC: Follow-up  HPI: This is a pleasant 42 year old female, we did a knee injection on the left about a month ago, she did really well, only has a bit of discomfort and a bit of popping.  She is for the most part happy with the left knee, she is interested in Visco supplementation.  She has however noted increasing pain in her right knee during the time that she was limping on her left. She does desire interventional treatment today.  Pain is moderate, persistent, localized to medial joint line without radiation.  I reviewed the past medical history, family history, social history, surgical history, and allergies today and no changes were needed.  Please see the problem list section below in epic for further details.  Past Medical History: No past medical history on file. Past Surgical History: Past Surgical History:  Procedure Laterality Date  . CHOLECYSTECTOMY  2009  . TONSILECTOMY/ADENOIDECTOMY WITH MYRINGOTOMY  12/2010   Social History: Social History   Socioeconomic History  . Marital status: Divorced    Spouse name: Not on file  . Number of children: 2  . Years of education: some colle  . Highest education level: Not on file  Occupational History  . 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 Financial resource strain: Not on file  . Food insecurity:    Worry: Not on file    Inability: Not on file  . Transportation needs:    Medical: Not on file    Non-medical: Not on file  Tobacco Use  . Smoking status: Never Smoker  . Smokeless tobacco: Never Used  Substance and Sexual Activity  . Alcohol use: No  . Drug use: No  . Sexual activity: Not Currently    Birth control/protection: Injection    Comment: Depo  Lifestyle  . Physical activity:    Days per week: Not on file    Minutes per session: Not on file  . Stress: Not on file  Relationships  . Social  connections:    Talks on phone: Not on file    Gets together: Not on file    Attends religious service: Not on file    Active member of club or organization: Not on file    Attends meetings of clubs or organizations: Not on file    Relationship status: Not on file  Other Topics Concern  . Not on file  Social History Narrative   Lives with 42 yo daughter and 42 yo son.    Family History: Family History  Problem Relation Age of Onset  . Heart disease Father   . Stroke Brother   . Cancer Paternal Aunt        leukemia  . Cancer Paternal Grandmother [phone]       breast  . Stroke Paternal Grandfather   . Cancer Mother        pancreatic   Allergies: No Known Allergies Medications: See med rec.  Review of Systems: No fevers, chills, night sweats, weight loss, chest pain, or shortness of breath.   Objective:    General: Well Developed, well nourished, and in no acute distress.  Neuro: Alert and oriented x3, extra-ocular muscles intact, sensation grossly intact.  HEENT: Normocephalic, atraumatic, pupils equal round reactive to light, neck supple, no masses, no lymphadenopathy, thyroid nonpalpable.  Skin: Warm and dry, no rashes. Cardiac: Regular rate  and rhythm, no murmurs rubs or gallops, no lower extremity edema.  Respiratory: Clear to auscultation bilaterally. Not using accessory muscles, speaking in full sentences. Right knee: Normal to inspection with no erythema or effusion or obvious bony abnormalities. Palpation normal with no warmth or joint line tenderness or patellar tenderness or condyle tenderness. ROM normal in flexion and extension and lower leg rotation. Ligaments with solid consistent endpoints including ACL, PCL, LCL, MCL. Negative Mcmurray's and provocative meniscal tests. Non painful patellar compression. Patellar and quadriceps tendons unremarkable. Hamstring and quadriceps strength is normal.  Procedure: Real-time Ultrasound Guided Injection of right  knee Device: GE Logiq E  Verbal informed consent obtained.  Time-out conducted.  Noted no overlying erythema, induration, or other signs of local infection.  Skin prepped in a sterile fashion.  Local anesthesia: Topical Ethyl chloride.  With sterile technique and under real time ultrasound guidance: 1 cc Kenalog 40, 2 cc lidocaine, 2 cc bupivacaine injected easily Completed without difficulty  Pain immediately resolved suggesting accurate placement of the medication.  Advised to call if fevers/chills, erythema, induration, drainage, or persistent bleeding.  Images permanently stored and available for review in the ultrasound unit.  Impression: Technically successful ultrasound guided injection.  Impression and Recommendations:    Primary osteoarthritis of both knees Moderate response to injection in the left knee. She still has some discomfort, but more importantly she has similar discomfort in her right knee and desires injection today. I am going to get her approved for Visco supplementation, she is going to look into it, she can come in for injections when approved. The MRI did show run-of-the-mill osteoarthritis without much in line of meniscal tearing. ___________________________________________ Ihor Austin. Benjamin Stain, M.D., ABFM., CAQSM. Primary Care and Sports Medicine [facility]  Adjunct Professor of Family Medicine  [employer]

## 2018-01-16 ENCOUNTER — Ambulatory Visit: Payer: BLUE CROSS/BLUE SHIELD | Admitting: Sports Medicine

## 2018-01-16 ENCOUNTER — Telehealth: Payer: Self-pay | Admitting: Sports Medicine

## 2018-01-16 NOTE — Telephone Encounter (Signed)
-----   Message from Neldon LabellaHolden S Mabe, [place] sent at 01/16/2018 11:29 AM EST ----- Information has been submitted to [facility] and awaiting determination.   ----- Message ----- From: Monica Bectonhekkekandam, Thomas J, MD Sent: 01/15/2018   9:25 AM EST To: Neldon LabellaHolden S Mabe, CMA  [facility] approval please, both knees.  X-ray and MRI confirmed, failed steroid injections. ___________________________________________ Ihor Austinhomas J. Benjamin Stainhekkekandam, M.D., ABFM., CAQSM. Primary Care and Sports Medicine [place] [facility]  Adjunct Professor of Family Medicine  [employer]

## 2018-01-26 ENCOUNTER — Other Ambulatory Visit: Payer: Self-pay | Admitting: Physician Assistant

## 2018-01-26 DIAGNOSIS — M25562 Pain in left knee: Secondary | ICD-10-CM

## 2018-01-26 DIAGNOSIS — M2392 Unspecified internal derangement of left knee: Secondary | ICD-10-CM

## 2018-01-31 ENCOUNTER — Encounter: Payer: Self-pay | Admitting: Physician Assistant

## 2018-01-31 NOTE — Telephone Encounter (Signed)
Received a fax that Synvisc is the preferred product for this patient. I have faxed the form to insurance and waiting on a response.

## 2018-02-07 NOTE — Telephone Encounter (Signed)
Brace issue addressed, and patient was advised of lab results.

## 2018-02-11 ENCOUNTER — Other Ambulatory Visit: Payer: Self-pay | Admitting: Physician Assistant

## 2018-02-11 NOTE — Telephone Encounter (Signed)
Per insurance Speciality injections are not covered since they are not medically necessary. Message has been left for patient to call with any questions.

## 2018-02-12 ENCOUNTER — Other Ambulatory Visit: Payer: Self-pay | Admitting: Physician Assistant

## 2018-02-12 ENCOUNTER — Encounter: Payer: Self-pay | Admitting: Sports Medicine

## 2018-02-12 ENCOUNTER — Ambulatory Visit (INDEPENDENT_AMBULATORY_CARE_PROVIDER_SITE_OTHER): Payer: BLUE CROSS/BLUE SHIELD | Admitting: Sports Medicine

## 2018-02-12 DIAGNOSIS — M170 Bilateral primary osteoarthritis of knee: Secondary | ICD-10-CM | POA: Diagnosis not present

## 2018-02-12 LAB — TSH: TSH: 4.11 m[IU]/L

## 2018-02-12 MED ORDER — ACETAMINOPHEN ER 650 MG PO TBCR: 650.0000 mg | EXTENDED_RELEASE_TABLET | Freq: Three times a day (TID) | ORAL | 3 refills | Status: AC | PRN

## 2018-02-12 MED ORDER — CELECOXIB 200 MG PO CAPS: ORAL_CAPSULE | ORAL | 2 refills | Status: DC

## 2018-02-12 NOTE — Assessment & Plan Note (Addendum)
Partial response to bilateral knee injections. Her insurance plan does not cover Visco supplementation injections. She was inadvertently supplementing ibuprofen and naproxen with meloxicam. Cautioned to use only one anti-inflammatory at a time. Switching from all of the above to Celebrex. She will also do arthritis strength Tylenol 1-2 times per day. We did discuss intra-articular PRP, its pros and cons, and she will look into this.

## 2018-02-12 NOTE — Progress Notes (Signed)
Subjective:    CC: Follow-up  HPI: Bilateral knee osteoarthritis: Has had bilateral injections, good response in the left knee, still with some discomfort in the right.  She has been inadvertently taking multiple NSAIDs together at the same time.  She is agreeable to switch to Celebrex, and simply use arthritis strength Tylenol.  Overall she does feel as though she can live with it.  I reviewed the past medical history, family history, social history, surgical history, and allergies today and no changes were needed.  Please see the problem list section below in epic for further details.  Past Medical History: No past medical history on file. Past Surgical History: Past Surgical History:  Procedure Laterality Date  . CHOLECYSTECTOMY  2009  . TONSILECTOMY/ADENOIDECTOMY WITH MYRINGOTOMY  12/2010   Social History: Social History   Socioeconomic History  . Marital status: Divorced    Spouse name: Not on file  . Number of children: 2  . Years of education: some colle  . Highest education level: Not on file  Occupational History  . 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 Financial resource strain: Not on file  . Food insecurity:    Worry: Not on file    Inability: Not on file  . Transportation needs:    Medical: Not on file    Non-medical: Not on file  Tobacco Use  . Smoking status: Never Smoker  . Smokeless tobacco: Never Used  Substance and Sexual Activity  . Alcohol use: No  . Drug use: No  . Sexual activity: Not Currently    Birth control/protection: Injection    Comment: Depo  Lifestyle  . Physical activity:    Days per week: Not on file    Minutes per session: Not on file  . Stress: Not on file  Relationships  . Social connections:    Talks on phone: Not on file    Gets together: Not on file    Attends religious service: Not on file    Active member of club or  organization: Not on file    Attends meetings of clubs or organizations: Not on file    Relationship status: Not on file  Other Topics Concern  . Not on file  Social History Narrative   Lives with 54 yo daughter and 28 yo son.    Family History: Family History  Problem Relation Age of Onset  . Heart disease Father   . Stroke Brother   . Cancer Paternal Aunt        leukemia  . Cancer Paternal Grandmother [phone]       breast  . Stroke Paternal Grandfather   . Cancer Mother        pancreatic   Allergies: No Known Allergies Medications: See med rec.  Review of Systems: No fevers, chills, night sweats, weight loss, chest pain, or shortness of breath.   Objective:    General: Well Developed, well nourished, and in no acute distress.  Neuro: Alert and oriented x3, extra-ocular muscles intact, sensation grossly intact.  HEENT: Normocephalic, atraumatic, pupils equal round reactive to light, neck supple, no masses, no lymphadenopathy, thyroid nonpalpable.  Skin: Warm and dry, no rashes. Cardiac: Regular rate and rhythm, no murmurs rubs or gallops, no lower extremity edema.  Respiratory: Clear to auscultation bilaterally. Not using accessory muscles, speaking in full sentences.  Impression and Recommendations:  Primary osteoarthritis of both knees Partial response to bilateral knee injections. Her insurance plan does not cover Visco supplementation injections. She was inadvertently supplementing ibuprofen and naproxen with meloxicam. Cautioned to use only one anti-inflammatory at a time. Switching from all of the above to Celebrex. She will also do arthritis strength Tylenol 1-2 times per day. We did discuss intra-articular PRP, its pros and cons, and she will look into this.  I spent 25 minutes with this patient, greater than 50% was face-to-face time counseling regarding the above diagnoses,, specifically discussing the mechanism, pros, cons, limitations of PRP intra-articular  injections. ___________________________________________ Ihor Austinhomas J. Benjamin Stainhekkekandam, M.D., ABFM., CAQSM. Primary Care and Sports Medicine  [facility]  Adjunct Professor of Family Medicine  [employer]

## 2018-02-13 ENCOUNTER — Other Ambulatory Visit: Payer: Self-pay

## 2018-02-13 DIAGNOSIS — E038 Other specified hypothyroidism: Secondary | ICD-10-CM

## 2018-02-13 MED ORDER — LEVOTHYROXINE SODIUM 112 MCG PO TABS: ORAL_TABLET | ORAL | 4 refills | Status: DC

## 2018-02-13 NOTE — Progress Notes (Signed)
Call pt: thyroid is in normal range. Ok to send 1 year refills.

## 2018-03-12 ENCOUNTER — Ambulatory Visit (INDEPENDENT_AMBULATORY_CARE_PROVIDER_SITE_OTHER): Payer: BLUE CROSS/BLUE SHIELD | Admitting: Sports Medicine

## 2018-03-12 ENCOUNTER — Encounter: Payer: Self-pay | Admitting: Sports Medicine

## 2018-03-12 DIAGNOSIS — M170 Bilateral primary osteoarthritis of knee: Secondary | ICD-10-CM

## 2018-03-12 NOTE — Progress Notes (Signed)
Subjective:    CC: Follow-up  HPI: Bilateral knee osteoarthritis: Overall well controlled with current medication regimen.  I reviewed the past medical history, family history, social history, surgical history, and allergies today and no changes were needed.  Please see the problem list section below in epic for further details.  Past Medical History: No past medical history on file. Past Surgical History: Past Surgical History:  Procedure Laterality Date  . CHOLECYSTECTOMY  2009  . TONSILECTOMY/ADENOIDECTOMY WITH MYRINGOTOMY  12/2010   Social History: Social History   Socioeconomic History  . Marital status: Divorced    Spouse name: Not on file  . Number of children: 2  . Years of education: some colle  . Highest education level: Not on file  Occupational History  . 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 Financial resource strain: Not on file  . Food insecurity:    Worry: Not on file    Inability: Not on file  . Transportation needs:    Medical: Not on file    Non-medical: Not on file  Tobacco Use  . Smoking status: Never Smoker  . Smokeless tobacco: Never Used  Substance and Sexual Activity  . Alcohol use: No  . Drug use: No  . Sexual activity: Not Currently    Birth control/protection: Injection    Comment: Depo  Lifestyle  . Physical activity:    Days per week: Not on file    Minutes per session: Not on file  . Stress: Not on file  Relationships  . Social connections:    Talks on phone: Not on file    Gets together: Not on file    Attends religious service: Not on file    Active member of club or organization: Not on file    Attends meetings of clubs or organizations: Not on file    Relationship status: Not on file  Other Topics Concern  . Not on file  Social History Narrative   Lives with 43 yo daughter and 43 yo son.    Family History: Family  History  Problem Relation Age of Onset  . Heart disease Father   . Stroke Brother   . Cancer Paternal Aunt        leukemia  . Cancer Paternal Grandmother [phone]       breast  . Stroke Paternal Grandfather   . Cancer Mother        pancreatic   Allergies: No Known Allergies Medications: See med rec.  Review of Systems: No fevers, chills, night sweats, weight loss, chest pain, or shortness of breath.   Objective:    General: Well Developed, well nourished, and in no acute distress.  Neuro: Alert and oriented x3, extra-ocular muscles intact, sensation grossly intact.  HEENT: Normocephalic, atraumatic, pupils equal round reactive to light, neck supple, no masses, no lymphadenopathy, thyroid nonpalpable.  Skin: Warm and dry, no rashes. Cardiac: Regular rate and rhythm, no murmurs rubs or gallops, no lower extremity edema.  Respiratory: Clear to auscultation bilaterally. Not using accessory muscles, speaking in full sentences.  Impression and Recommendations:    Primary osteoarthritis of both knees Partial response to bilateral knee injections. Overall she is happy with doing Celebrex twice a day and an arthritis strength Tylenol several times a day. Viscosupplementation was a plan exclusion. She is also considering PRP and will call me if she desires to  do it. We did discuss the pros and cons of intra-articular PRP. She can return to see me as needed. ___________________________________________ Ihor Austin. Benjamin Stain, M.D., ABFM., CAQSM. Primary Care and Sports Medicine [facility]  Adjunct Professor of Family Medicine  [employer]

## 2018-03-12 NOTE — Assessment & Plan Note (Signed)
Partial response to bilateral knee injections. Overall she is happy with doing Celebrex twice a day and an arthritis strength Tylenol several times a day. Viscosupplementation was a plan exclusion. She is also considering PRP and will call me if she desires to do it. We did discuss the pros and cons of intra-articular PRP. She can return to see me as needed.

## 2018-03-13 ENCOUNTER — Other Ambulatory Visit: Payer: Self-pay | Admitting: Physician Assistant

## 2018-03-13 DIAGNOSIS — E119 Type 2 diabetes mellitus without complications: Secondary | ICD-10-CM

## 2018-03-17 ENCOUNTER — Other Ambulatory Visit: Payer: Self-pay | Admitting: Physician Assistant

## 2018-04-02 ENCOUNTER — Ambulatory Visit (INDEPENDENT_AMBULATORY_CARE_PROVIDER_SITE_OTHER): Payer: BLUE CROSS/BLUE SHIELD

## 2018-04-02 ENCOUNTER — Encounter: Payer: Self-pay | Admitting: Physician Assistant

## 2018-04-02 ENCOUNTER — Ambulatory Visit: Payer: BLUE CROSS/BLUE SHIELD | Admitting: Physician Assistant

## 2018-04-02 VITALS — BP 124/83 | HR 71 | Temp 97.1°F | Wt 223.0 lb

## 2018-04-02 DIAGNOSIS — N6310 Unspecified lump in the right breast, unspecified quadrant: Secondary | ICD-10-CM | POA: Insufficient documentation

## 2018-04-02 DIAGNOSIS — E119 Type 2 diabetes mellitus without complications: Secondary | ICD-10-CM | POA: Diagnosis not present

## 2018-04-02 DIAGNOSIS — M722 Plantar fascial fibromatosis: Secondary | ICD-10-CM

## 2018-04-02 DIAGNOSIS — R928 Other abnormal and inconclusive findings on diagnostic imaging of breast: Secondary | ICD-10-CM | POA: Diagnosis not present

## 2018-04-02 DIAGNOSIS — Z23 Encounter for immunization: Secondary | ICD-10-CM

## 2018-04-02 DIAGNOSIS — Z1231 Encounter for screening mammogram for malignant neoplasm of breast: Secondary | ICD-10-CM

## 2018-04-02 DIAGNOSIS — Z3009 Encounter for other general counseling and advice on contraception: Secondary | ICD-10-CM

## 2018-04-02 DIAGNOSIS — Z6839 Body mass index (BMI) 39.0-39.9, adult: Secondary | ICD-10-CM

## 2018-04-02 LAB — POCT GLYCOSYLATED HEMOGLOBIN (HGB A1C): HEMOGLOBIN A1C: 5.3 % (ref 4.0–5.6)

## 2018-04-02 LAB — POCT UA - MICROALBUMIN
Albumin/Creatinine Ratio, Urine, POC: <30
Creatinine, POC: 200 mg/dL
MICROALBUMIN (UR) POC: 10 mg/L

## 2018-04-02 MED ORDER — NORETHINDRONE ACET-ETHINYL EST 1.5-30 MG-MCG PO TABS
1.0000 | ORAL_TABLET | Freq: Every day | ORAL | 11 refills | Status: DC
Start: 2018-04-02 — End: 2019-04-15

## 2018-04-02 MED ORDER — PHENTERMINE HCL 15 MG PO CAPS: 15.0000 mg | ORAL_CAPSULE | ORAL | 0 refills | Status: DC

## 2018-04-02 NOTE — Patient Instructions (Signed)

## 2018-04-02 NOTE — Progress Notes (Signed)
Call pt: there was a possible mass with right breast asymmetry. We do need to get some more imaging. Let us know if imaging does not contact you in the near future.

## 2018-04-02 NOTE — Progress Notes (Signed)
Subjective:    Patient ID: Tina Boone, female    DOB: 06/20/1975, 43 y.o.   MRN: [id number]  HPI  Patient is a 43 year old female with type 2 diabetes who presents to the clinic to follow-up on diabetes and weight.  Patient is doing really well on her weight regimen.  She is on a lower dose of phentermine 15 mg and Topamax twice daily.  She is tolerating this regimen well.  She has lost another 9 pounds in 3 months.  She denies any chest pain, palpitations, dry mouth, insomnia.  She is not checking her sugars associated with diabetes.  She is taking Synjardy regularly.  She denies any hypoglycemia.  She denies any open sores or wounds.  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  She does complain of intermittent pain of her right heel.  She does not do anything to make better.  Walking seems to make it worse.  Pain is not every day.  She is interested in restarting birth control.  She thinks she could be getting into a sexual relationship in the near future.  She would like pregnancy prevention.  .. Active Ambulatory Problems    Diagnosis Date Noted  . Contraception management 03/06/2013  . Elevated blood pressure 03/06/2013  . Essential hypertension, benign 04/01/2013  . Hypothyroidism 04/03/2013  . Hypertriglyceridemia without hypercholesterolemia 09/23/2015  . Adjustment disorder with mixed anxiety and depressed mood 06/22/2016  . Controlled type 2 diabetes mellitus without complication, without long-term current use of insulin (HCC) 06/25/2017  . Class 2 severe obesity due to excess calories with serious comorbidity and body mass index (BMI) of 39.0 to 39.9 in adult (HCC) 06/25/2017  . Elevated fasting glucose 06/25/2017  . Primary osteoarthritis of both knees 12/10/2017  . Breast mass, right 04/02/2018  . Plantar fasciitis, right 04/07/2018   Resolved Ambulatory Problems    Diagnosis  Date Noted  . Morbid obesity with BMI of 45.0-49.9, adult (HCC) 01/23/2012  . Well adult exam 01/23/2012   No Additional Past Medical History     Review of Systems    see HPI>  Objective:   Physical Exam Vitals signs reviewed.  Constitutional:      Appearance: Normal appearance.  HENT:     Head: Normocephalic and atraumatic.  Cardiovascular:     Rate and Rhythm: Normal rate and regular rhythm.     Pulses: Normal pulses.     Heart sounds: Normal heart sounds.  Pulmonary:     Effort: Pulmonary effort is normal.     Breath sounds: Normal breath sounds.  Skin:    Comments: Tenderness over right heel.   Neurological:     General: No focal deficit present.     Mental Status: She is alert and oriented to person, place, and time.  Psychiatric:        Mood and Affect: Mood normal.        Behavior: Behavior normal.           Assessment & Plan:  Marland KitchenMarland KitchenJalexy was seen today for follow-up.  Diagnoses and all orders for this visit:  Controlled type 2 diabetes mellitus without complication, without long-term current use of insulin (HCC) -     POCT HgB A1C -     POCT UA - Microalbumin  Plantar fasciitis, right  Visit for screening mammogram -     MM 3D  SCREEN BREAST BILATERAL  Class 2 severe obesity due to excess calories with serious comorbidity and body mass index (BMI) of 39.0 to 39.9 in adult (HCC) -     phentermine 15 MG capsule; Take 1 capsule (15 mg total) by mouth every morning.  Need for pneumococcal vaccination -     Pneumococcal conjugate vaccine 13-valent  Birth control counseling -     Norethindrone Acetate-Ethinyl Estradiol (JUNEL 1.5/30) 1.5-30 MG-MCG tablet; Take 1 tablet by mouth daily.   A!C looks great.  BP great.  On statin.  Needs pneumonia vaccine. Upon review after chart prevnar 13 was given instead of pneumonia 23. She will still need 23. Will send note to clinical lead.   .. Diabetic Foot Exam - Simple   Simple Foot Form Visual  Inspection No deformities, no ulcerations, no other skin breakdown bilaterally:  Yes Sensation Testing Intact to touch and monofilament testing bilaterally:  Yes Pulse Check Posterior Tibialis and Dorsalis pulse intact bilaterally:  Yes Comments     .Marland Kitchen Results for orders placed or performed in visit on 04/02/18  POCT HgB A1C  Result Value Ref Range   Hemoglobin A1C 5.3 4.0 - 5.6 %   HbA1c POC (<> result, manual entry)     HbA1c, POC (prediabetic range)     HbA1c, POC (controlled diabetic range)    POCT UA - Microalbumin  Result Value Ref Range   Microalbumin Ur, POC 10 mg/L   Creatinine, POC 200 mg/dL   Albumin/Creatinine Ratio, Urine, POC <30    Needs eye exam.   Continue on phentermine and topamax for weight. Follow up in 3 months.   Discussed plantar fascitis. NSAIds as needed. HO given. Ice and rest when can. Wear really good supportive shoes.   Given OCP. Discussed increased risk of stroke, DvT and breast cancer. Ordered mammogram. Pt does not smoke. Discussed does not protect against STI's. Use condoms. Pap up to date.

## 2018-04-03 ENCOUNTER — Other Ambulatory Visit: Payer: Self-pay | Admitting: Physician Assistant

## 2018-04-03 DIAGNOSIS — R928 Other abnormal and inconclusive findings on diagnostic imaging of breast: Secondary | ICD-10-CM

## 2018-04-07 ENCOUNTER — Encounter: Payer: Self-pay | Admitting: Physician Assistant

## 2018-04-07 DIAGNOSIS — M722 Plantar fascial fibromatosis: Secondary | ICD-10-CM | POA: Insufficient documentation

## 2018-04-08 NOTE — Progress Notes (Signed)
We can do it at her next 3 month follow up. No need to bring her in sooner.

## 2018-04-08 NOTE — Progress Notes (Signed)
Per PCP, OK to get Pneumonia 23 at next OV in 3 months.

## 2018-04-09 ENCOUNTER — Ambulatory Visit
Admission: RE | Admit: 2018-04-09 | Discharge: 2018-04-09 | Disposition: A | Discharge status: Home / Self Care | Payer: BLUE CROSS/BLUE SHIELD | Source: Ambulatory Visit | Attending: Physician Assistant | Admitting: Physician Assistant

## 2018-04-09 ENCOUNTER — Other Ambulatory Visit: Payer: Self-pay | Admitting: Physician Assistant

## 2018-04-09 DIAGNOSIS — R928 Other abnormal and inconclusive findings on diagnostic imaging of breast: Secondary | ICD-10-CM

## 2018-04-09 DIAGNOSIS — N6310 Unspecified lump in the right breast, unspecified quadrant: Secondary | ICD-10-CM

## 2018-04-09 DIAGNOSIS — N6489 Other specified disorders of breast: Secondary | ICD-10-CM

## 2018-05-10 ENCOUNTER — Encounter: Payer: Self-pay | Admitting: Physician Assistant

## 2018-05-12 ENCOUNTER — Telehealth: Payer: Self-pay | Admitting: Physician Assistant

## 2018-05-12 ENCOUNTER — Telehealth (INDEPENDENT_AMBULATORY_CARE_PROVIDER_SITE_OTHER): Payer: BLUE CROSS/BLUE SHIELD | Admitting: Physician Assistant

## 2018-05-12 ENCOUNTER — Encounter: Payer: Self-pay | Admitting: Physician Assistant

## 2018-05-12 ENCOUNTER — Other Ambulatory Visit: Payer: Self-pay

## 2018-05-12 VITALS — Temp 96.7°F | Wt 215.0 lb

## 2018-05-12 DIAGNOSIS — B372 Candidiasis of skin and nail: Secondary | ICD-10-CM | POA: Diagnosis not present

## 2018-05-12 DIAGNOSIS — E6609 Other obesity due to excess calories: Secondary | ICD-10-CM

## 2018-05-12 DIAGNOSIS — Z6833 Body mass index (BMI) 33.0-33.9, adult: Secondary | ICD-10-CM

## 2018-05-12 DIAGNOSIS — E119 Type 2 diabetes mellitus without complications: Secondary | ICD-10-CM | POA: Diagnosis not present

## 2018-05-12 MED ORDER — EMPAGLIFLOZIN-METFORMIN HCL ER 10-1000 MG PO TB24: 1.0000 | ORAL_TABLET | Freq: Every day | ORAL | 1 refills | Status: DC

## 2018-05-12 MED ORDER — NYSTATIN-TRIAMCINOLONE 100000-0.1 UNIT/GM-% EX CREA: 1.0000 "application " | TOPICAL_CREAM | Freq: Two times a day (BID) | CUTANEOUS | 1 refills | Status: DC

## 2018-05-12 MED ORDER — TOPIRAMATE 25 MG PO TABS: 25.0000 mg | ORAL_TABLET | Freq: Two times a day (BID) | ORAL | 2 refills | Status: DC

## 2018-05-12 NOTE — Progress Notes (Signed)
..Virtual Visit via Video Note  I connected with Tina Boone on 05/14/18 at  9:30 AM EDT by a video enabled telemedicine application and verified that I am speaking with the correct person using two identifiers.   I discussed the limitations of evaluation and management by telemedicine and the availability of in person appointments. The patient expressed understanding and agreed to proceed.  History of Present Illness: Patient is a obese 43 year old female with type 2 diabetes who complains of rash under her abdomen.  Rash is recurring and seems to respond to Goldbond most of the time.  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  She admits to being hot and sweaty most of the day.  She is actively trying to lose weight.  Goldbond does not seem to be helping.  Rash under her abdomen is red, irritating and at times has a smell.  Patient denies any fever, chills.  Patient does admits at times she has symptoms of vaginal yeast infections 2.  Monistat has helped this.  Patient does admit she needs refills on Topamax and Synjardy.  .. Active Ambulatory Problems    Diagnosis Date Noted  . Contraception management 03/06/2013  . Elevated blood pressure 03/06/2013  . Essential hypertension, benign 04/01/2013  . Hypothyroidism 04/03/2013  . Hypertriglyceridemia without hypercholesterolemia 09/23/2015  . Adjustment disorder with mixed anxiety and depressed mood 06/22/2016  . Controlled type 2 diabetes mellitus without complication, without long-term current use of insulin (HCC) 06/25/2017  . Class 2 severe obesity due to excess calories with serious comorbidity and body mass index (BMI) of 39.0 to 39.9 in adult (HCC) 06/25/2017  . Elevated fasting glucose 06/25/2017  . Primary osteoarthritis of both knees 12/10/2017  . Breast mass, right 04/02/2018  . Plantar fasciitis, right 04/07/2018   Resolved Ambulatory Problems    Diagnosis Date Noted  . Morbid obesity with BMI of 45.0-49.9, adult  (HCC) 01/23/2012  . Well adult exam 01/23/2012   No Additional Past Medical History   Reviewed med, allergy, problem list.     Observations/Objective: No acute distress .Marland Kitchen Today's Vitals   05/12/18 0936  Temp: (!) 96.7 F (35.9 C)  TempSrc: Oral  Weight: 215 lb (97.5 kg)   Body mass index is 33.67 kg/m.  Rash under abdomina; pannus erythematous macular moist appearance with scattered erythematous lesions.  Assessment and Plan: Marland KitchenMarland KitchenDiagnoses and all orders for this visit:  Candidal intertrigo -     nystatin-triamcinolone (MYCOLOG II) cream; Apply 1 application topically 2 (two) times daily.  Controlled type 2 diabetes mellitus without complication, without long-term current use of insulin (HCC) -     Empagliflozin-metFORMIN HCl ER (SYNJARDY XR) 11-998 MG TB24; Take 1 tablet by mouth daily.  Class 1 obesity due to excess calories with serious comorbidity and body mass index (BMI) of 33.0 to 33.9 in adult -     topiramate (TOPAMAX) 25 MG tablet; Take 1 tablet (25 mg total) by mouth 2 (two) times daily.   Rash does sound like a yeast infection likely due to or worsening because of her daily sweating.  Strongly encourage patient to keep area dry.  I did give her a nystatin/triamcinolone cream to use twice daily for the next few days to see if it improves symptoms.  She may continue to use Goldbond to help keep area dry.  Certainly her weight loss and sagging skin is making this worse as well.  In the future we could consider some type of surgery to remove loose skin.  Synjardy could potentially be making her yeast infections worse.  If she finds that she continually gets a yeast infection we may need to consider other options.  Patient continues to stay very active and losing weight.  Refill Topamax.  She has plenty of the phentermine.  Follow Up Instructions:    I discussed the assessment and treatment plan with the patient. The patient was provided an opportunity to ask  questions and all were answered. The patient agreed with the plan and demonstrated an understanding of the instructions.   The patient was advised to call back or seek an in-person evaluation if the symptoms worsen or if the condition fails to improve as anticipated.  I provided 15 minutes of non-face-to-face time during this encounter.   Tandy Gaw, PA-C

## 2018-05-12 NOTE — Telephone Encounter (Signed)
See if we can get her scheduled for this morning!

## 2018-05-12 NOTE — Telephone Encounter (Signed)
Pt scheduled for this morning.

## 2018-05-12 NOTE — Progress Notes (Deleted)
-  rash under stomach  -reoccurring  -red/inflammed/  -using Gold Bond

## 2018-05-12 NOTE — Telephone Encounter (Signed)
mychart visit

## 2018-05-26 ENCOUNTER — Other Ambulatory Visit: Payer: Self-pay | Admitting: Sports Medicine

## 2018-05-26 DIAGNOSIS — M170 Bilateral primary osteoarthritis of knee: Secondary | ICD-10-CM

## 2018-06-23 ENCOUNTER — Other Ambulatory Visit: Payer: Self-pay | Admitting: Physician Assistant

## 2018-07-13 ENCOUNTER — Other Ambulatory Visit: Payer: Self-pay | Admitting: Physician Assistant

## 2018-07-13 DIAGNOSIS — E119 Type 2 diabetes mellitus without complications: Secondary | ICD-10-CM

## 2018-08-10 ENCOUNTER — Other Ambulatory Visit: Payer: Self-pay | Admitting: Sports Medicine

## 2018-08-10 ENCOUNTER — Other Ambulatory Visit: Payer: Self-pay | Admitting: Physician Assistant

## 2018-08-10 DIAGNOSIS — M170 Bilateral primary osteoarthritis of knee: Secondary | ICD-10-CM

## 2018-08-10 DIAGNOSIS — E6609 Other obesity due to excess calories: Secondary | ICD-10-CM

## 2018-09-07 ENCOUNTER — Other Ambulatory Visit: Payer: Self-pay | Admitting: Physician Assistant

## 2018-09-07 DIAGNOSIS — E119 Type 2 diabetes mellitus without complications: Secondary | ICD-10-CM

## 2018-09-22 ENCOUNTER — Other Ambulatory Visit: Payer: Self-pay | Admitting: Physician Assistant

## 2018-10-10 ENCOUNTER — Other Ambulatory Visit: Payer: Self-pay | Admitting: Physician Assistant

## 2018-10-10 DIAGNOSIS — E119 Type 2 diabetes mellitus without complications: Secondary | ICD-10-CM

## 2018-10-10 MED ORDER — SYNJARDY XR 10-1000 MG PO TB24: 1.0000 | ORAL_TABLET | Freq: Every day | ORAL | 0 refills | Status: DC

## 2018-10-10 NOTE — Telephone Encounter (Signed)
Pharmacist called and states can not split bottle for #15 pills. #30 sent with note that last refill.

## 2018-10-10 NOTE — Addendum Note (Signed)
Addended byAnnamaria Helling on: 10/10/2018 09:52 AM   Modules accepted: Orders

## 2018-10-15 ENCOUNTER — Other Ambulatory Visit: Payer: BLUE CROSS/BLUE SHIELD

## 2018-10-21 ENCOUNTER — Other Ambulatory Visit: Payer: BLUE CROSS/BLUE SHIELD

## 2018-11-06 ENCOUNTER — Other Ambulatory Visit: Payer: Self-pay | Admitting: Physician Assistant

## 2018-11-06 ENCOUNTER — Other Ambulatory Visit: Payer: Self-pay | Admitting: Sports Medicine

## 2018-11-06 DIAGNOSIS — E6609 Other obesity due to excess calories: Secondary | ICD-10-CM

## 2018-11-06 DIAGNOSIS — M170 Bilateral primary osteoarthritis of knee: Secondary | ICD-10-CM

## 2018-11-11 ENCOUNTER — Other Ambulatory Visit: Payer: Self-pay | Admitting: Physician Assistant

## 2018-11-11 DIAGNOSIS — E119 Type 2 diabetes mellitus without complications: Secondary | ICD-10-CM

## 2018-12-04 ENCOUNTER — Other Ambulatory Visit: Payer: Self-pay | Admitting: Physician Assistant

## 2018-12-16 ENCOUNTER — Other Ambulatory Visit: Payer: Self-pay | Admitting: Physician Assistant

## 2018-12-16 DIAGNOSIS — E038 Other specified hypothyroidism: Secondary | ICD-10-CM

## 2018-12-18 IMAGING — MR MR KNEE*L* W/O CM
4 of 7 series · 22 of 40 positions shown · non-contrast
Comparison: None.

CLINICAL DATA: Left knee pain.

EXAM:
MRI OF THE LEFT KNEE WITHOUT CONTRAST
TECHNIQUE: Multiplanar, multisequence MR imaging of the knee was performed. No
intravenous contrast was administered.

[Series 2: T2 fat-sat · axial · 4.0mm · 0.43mm/px · z∈[-43,+82]mm · 6 of 31 slices shown]
[im 1/31]
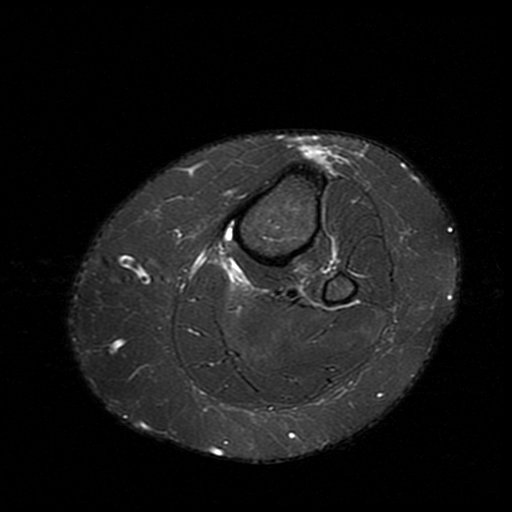
[im 5/31]
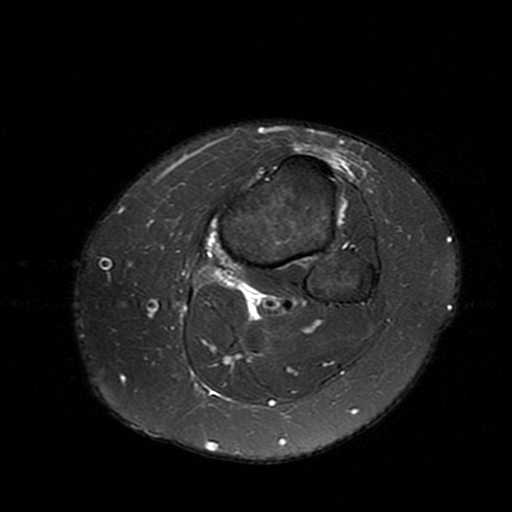
[im 9/31]
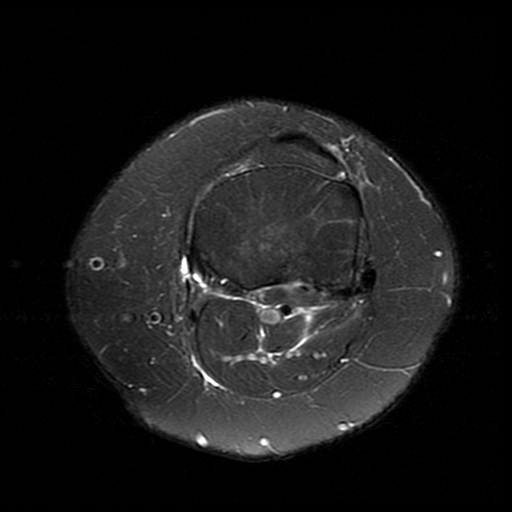
[im 13/31]
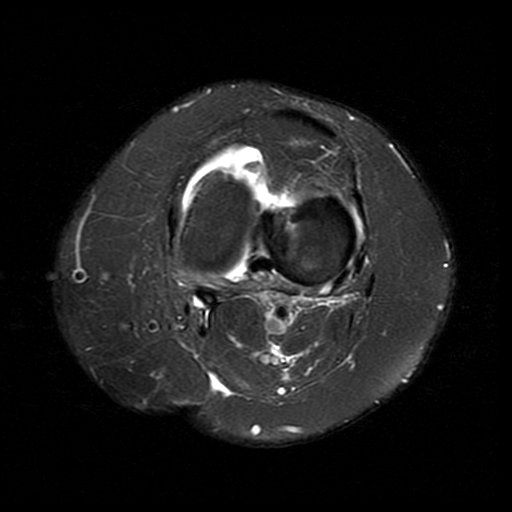
[im 18/31]
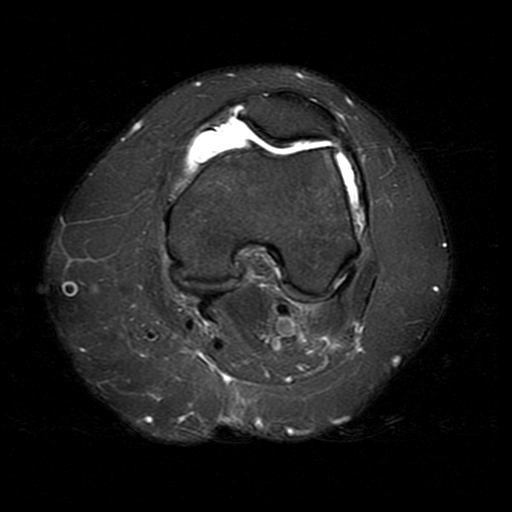
[im 26/31]
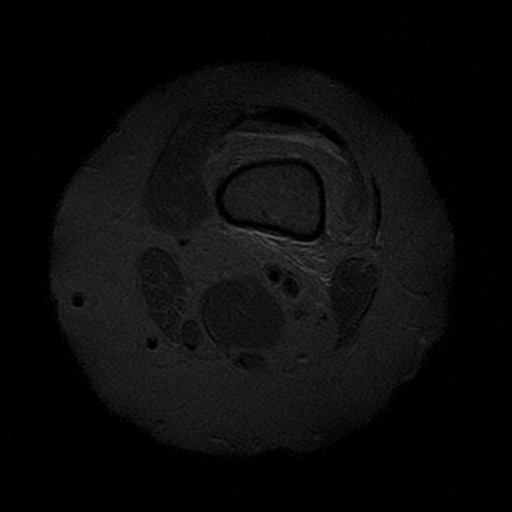

[Series 4: PD fat-sat · coronal · 4.0mm · 0.39mm/px · 5 of 24 slices shown (1 of 3)]
[im 1/24]
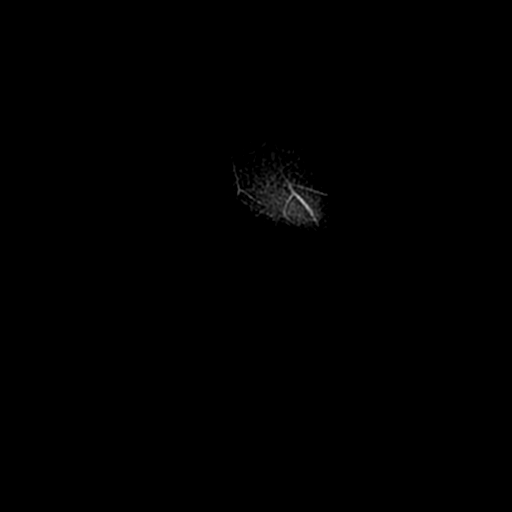
[im 6/24]
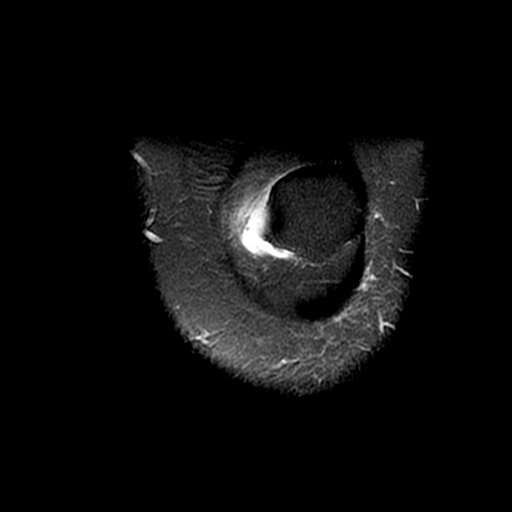
[im 12/24]
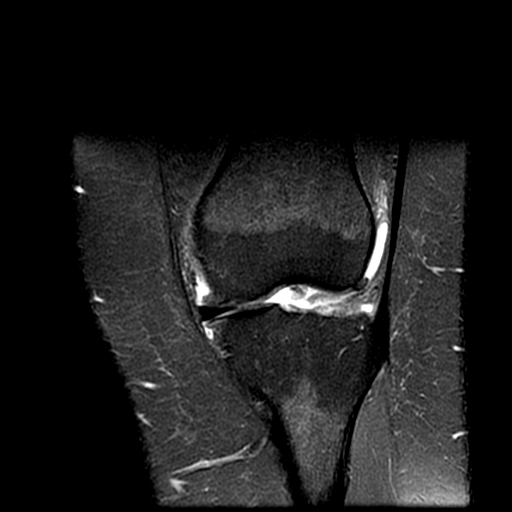
[im 18/24]
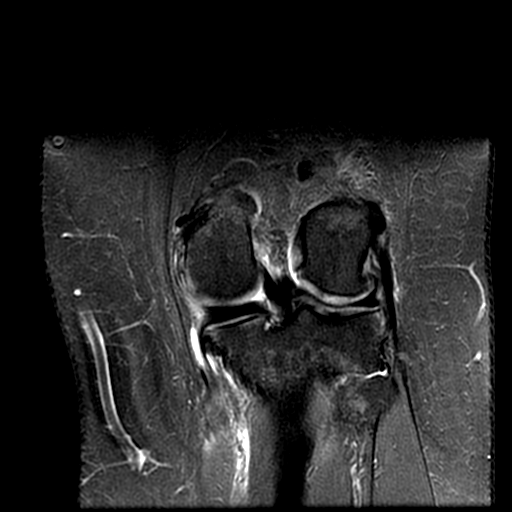
[im 24/24]
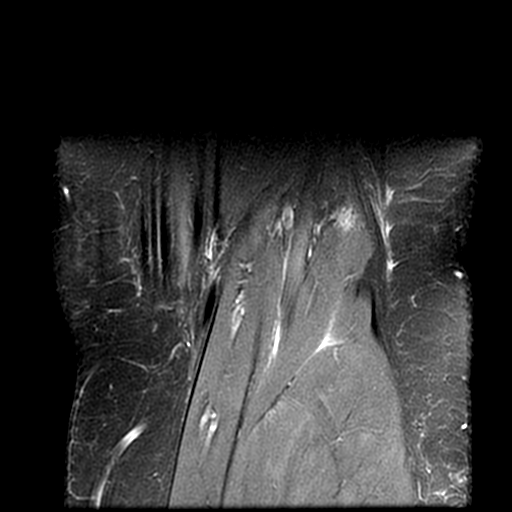

[Series 6: PD fat-sat · sagittal · 3.0mm · 0.39mm/px · 6 of 26 slices shown (2 of 3)]
[im 1/26]
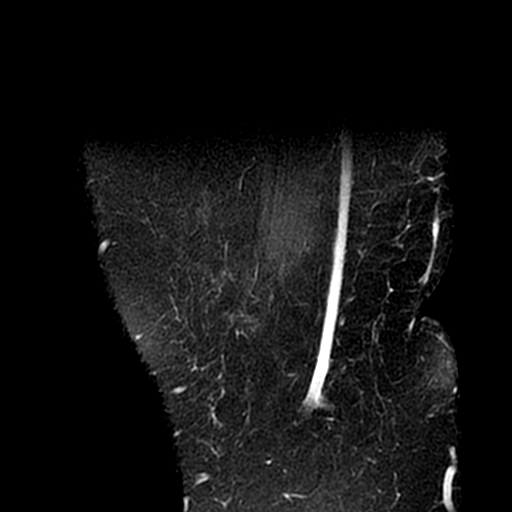
[im 6/26]
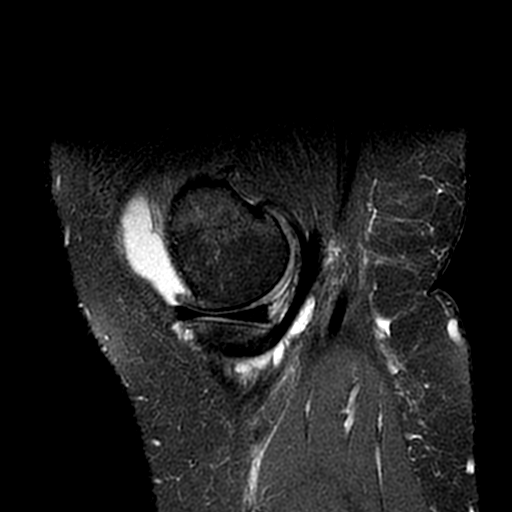
[im 11/26]
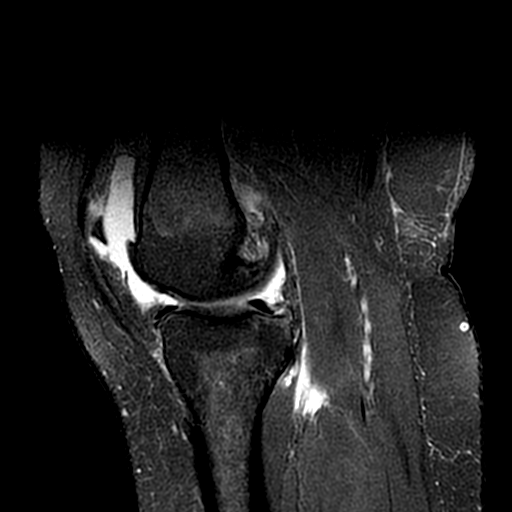
[im 16/26]
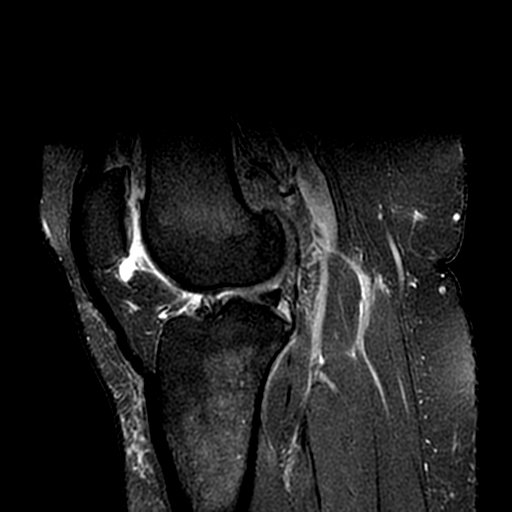
[im 21/26]
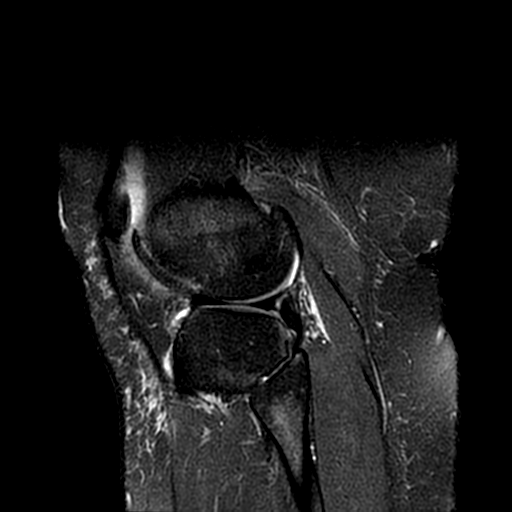
[im 26/26]
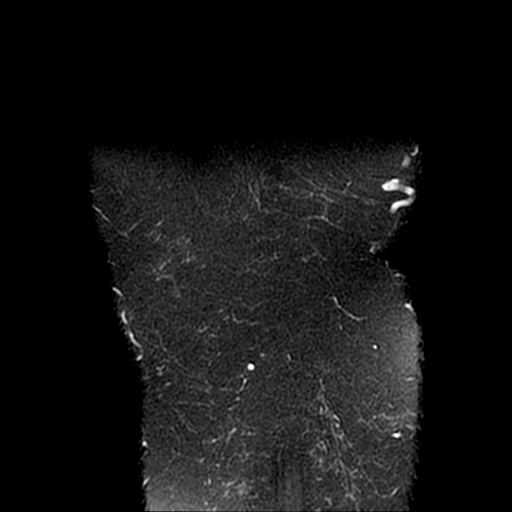

[Series 8: PD fat-sat · coronal · 2.0mm · 0.78mm/px · 5 of 21 slices shown (3 of 3)]
[im 1/21]
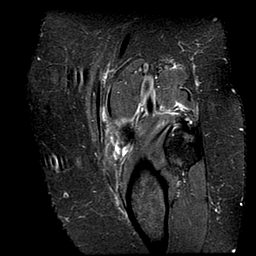
[im 6/21]
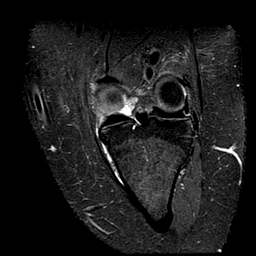
[im 11/21]
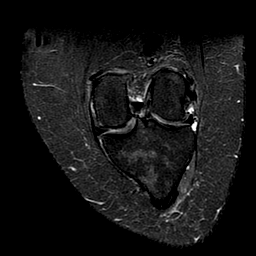
[im 16/21]
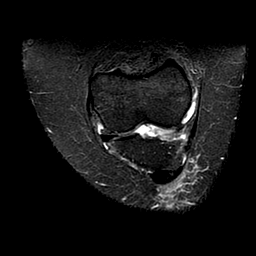
[im 21/21]
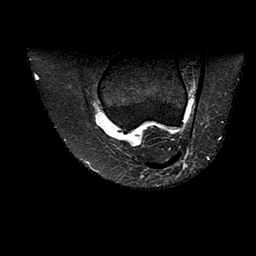

[22 of 40 positions shown; findings below may reference images not displayed]

FINDINGS: MENISCI

Medial meniscus:  Intact.

Lateral meniscus:  Intact.

LIGAMENTS

Cruciates:  Intact ACL and PCL.

Collaterals: Medial collateral ligament is intact. Lateral
collateral ligament complex is intact.

CARTILAGE

Patellofemoral: Partial-thickness cartilage loss the lateral
patellofemoral compartment with areas of full-thickness cartilage
loss of the lateral trochlea. Partial-thickness cartilage loss of
the medial patellofemoral compartment.

Medial: Partial-thickness cartilage loss of the medial femorotibial
compartment.

Lateral: Partial-thickness cartilage loss of the lateral
femorotibial compartment.

Joint: Small joint effusion. Normal Hoffa's fat. No plical
thickening.

Popliteal Fossa:  No Baker cyst. Intact popliteus tendon.

Extensor Mechanism: Intact quadriceps tendon. Intact patellar
tendon. Intact medial patellar retinaculum. Intact lateral patellar
retinaculum. Intact MPFL.

Bones:  No acute osseous abnormality.  No aggressive osseous lesion.

Other: Muscles are normal.  No fluid collection or hematoma.
IMPRESSION: 1. Tricompartmental cartilage abnormalities as described above.
2. No discrete meniscal or ligamentous injury of the left knee.

## 2018-12-24 ENCOUNTER — Encounter: Payer: Self-pay | Admitting: Physician Assistant

## 2018-12-24 ENCOUNTER — Ambulatory Visit (INDEPENDENT_AMBULATORY_CARE_PROVIDER_SITE_OTHER): Payer: BLUE CROSS/BLUE SHIELD | Admitting: Physician Assistant

## 2018-12-24 ENCOUNTER — Other Ambulatory Visit: Payer: Self-pay

## 2018-12-24 DIAGNOSIS — E6609 Other obesity due to excess calories: Secondary | ICD-10-CM | POA: Diagnosis not present

## 2018-12-24 DIAGNOSIS — E119 Type 2 diabetes mellitus without complications: Secondary | ICD-10-CM

## 2018-12-24 DIAGNOSIS — Z6833 Body mass index (BMI) 33.0-33.9, adult: Secondary | ICD-10-CM

## 2018-12-24 MED ORDER — TOPIRAMATE 25 MG PO TABS: 25.0000 mg | ORAL_TABLET | Freq: Two times a day (BID) | ORAL | 0 refills | Status: DC

## 2018-12-24 MED ORDER — PHENTERMINE HCL 15 MG PO CAPS: 15.0000 mg | ORAL_CAPSULE | Freq: Every morning | ORAL | 0 refills | Status: DC

## 2018-12-24 MED ORDER — SYNJARDY XR 10-1000 MG PO TB24: 1.0000 | ORAL_TABLET | Freq: Every day | ORAL | 0 refills | Status: DC

## 2018-12-24 NOTE — Progress Notes (Signed)
Last weight 223 lb, today's weight 196 lbs. Needs refills. No issues.  Needs labs.

## 2018-12-24 NOTE — Progress Notes (Signed)
Patient ID: Tina Boone, female   DOB: 10-09-1975, 43 y.o.   MRN: [id number] .Marland KitchenVirtual Visit via Video Note  I connected with Tina Boone on 12/24/18 at  8:10 AM EST by a video enabled telemedicine application and verified that I am speaking with the correct person using two identifiers.  Location: Patient: car Provider: clinic   I discussed the limitations of evaluation and management by telemedicine and the availability of in person appointments. The patient expressed understanding and agreed to proceed.  History of Present Illness: Pt is a 43 yo obese female with HTN, T2DM who calls into the clinic for refills.   She is doing great. She is walking a lot. She is watching her diet. She is down 28lbs. She would like to stay on this regimen. No CP, palpitations, headaches, vision changes.   .. Active Ambulatory Problems    Diagnosis Date Noted  . Contraception management 03/06/2013  . Elevated blood pressure 03/06/2013  . Essential hypertension, benign 04/01/2013  . Hypothyroidism 04/03/2013  . Hypertriglyceridemia without hypercholesterolemia 09/23/2015  . Adjustment disorder with mixed anxiety and depressed mood 06/22/2016  . Controlled type 2 diabetes mellitus without complication, without long-term current use of insulin ([place]) 06/25/2017  . Class 1 obesity due to excess calories with serious comorbidity and body mass index (BMI) of 33.0 to 33.9 in adult 06/25/2017  . Elevated fasting glucose 06/25/2017  . Primary osteoarthritis of both knees 12/10/2017  . Breast mass, right 04/02/2018  . Plantar fasciitis, right 04/07/2018   Resolved Ambulatory Problems    Diagnosis Date Noted  . Morbid obesity with BMI of 45.0-49.9, adult ([place]) 01/23/2012  . Well adult exam 01/23/2012   No Additional Past Medical History   Reviewed med, allergy, problem list.     Observations/Objective: No acute distress. Normal appearance and mood.  No labored breathing.    .. Today's Vitals   12/24/18 0800  BP: 125/85  Pulse: 75  Temp: 98.2 F (36.8 C)  TempSrc: Oral  Weight: 196 lb (88.9 kg)  Height: 5\' 7"  (1.702 m)   Body mass index is 30.7 kg/m.    Assessment and Plan: Marland KitchenMarland KitchenDiagnoses and all orders for this visit:  Controlled type 2 diabetes mellitus without complication, without long-term current use of insulin (HCC) -     Empagliflozin-metFORMIN HCl ER (SYNJARDY XR) 11-998 MG TB24; Take 1 tablet by mouth daily.  Class 1 obesity due to excess calories with serious comorbidity and body mass index (BMI) of 33.0 to 33.9 in adult -     phentermine 15 MG capsule; Take 1 capsule (15 mg total) by mouth every morning. -     topiramate (TOPAMAX) 25 MG tablet; Take 1 tablet (25 mg total) by mouth 2 (two) times daily.   Pt is doing great. Refilled medication. Continue diet and exercise. Needs A!C. Pt agrees to get labs. Last a1c 5.3. BP to goal.  On statin.  Needs flu and pneumonia shot.  Needs eye exam.  Follow up in 3 months.    Follow Up Instructions:    I discussed the assessment and treatment plan with the patient. The patient was provided an opportunity to ask questions and all were answered. The patient agreed with the plan and demonstrated an understanding of the instructions.   The patient was advised to call back or seek an in-person evaluation if the symptoms worsen or if the condition fails to improve as anticipated.    Iran Planas, PA-C

## 2018-12-25 ENCOUNTER — Encounter: Payer: Self-pay | Admitting: Physician Assistant

## 2019-02-25 ENCOUNTER — Other Ambulatory Visit: Payer: Self-pay | Admitting: Physician Assistant

## 2019-02-25 DIAGNOSIS — Z3009 Encounter for other general counseling and advice on contraception: Secondary | ICD-10-CM

## 2019-02-26 ENCOUNTER — Ambulatory Visit (INDEPENDENT_AMBULATORY_CARE_PROVIDER_SITE_OTHER): Payer: BC Managed Care – PPO | Admitting: Sports Medicine

## 2019-02-26 ENCOUNTER — Other Ambulatory Visit: Payer: Self-pay

## 2019-02-26 DIAGNOSIS — M170 Bilateral primary osteoarthritis of knee: Secondary | ICD-10-CM

## 2019-02-26 MED ORDER — CELECOXIB 200 MG PO CAPS: 200.0000 mg | ORAL_CAPSULE | Freq: Two times a day (BID) | ORAL | 11 refills | Status: DC | PRN

## 2019-02-26 NOTE — Assessment & Plan Note (Addendum)
Tina Boone is having recurrence of pain, she did have an MRI in 2019 that showed simply tricompartmental osteoarthritis without any meniscal tearing. We last injected Tina Boone's knee a year ago, she is having recurrence of discomfort in her left knee, repeat injection today. No mechanical symptoms, refilling Celebrex, she can return to see me as needed. Certainly PRP would be an option, viscosupplementation was unfortunately a plan exclusion.

## 2019-02-26 NOTE — Progress Notes (Signed)
    Procedures performed today:    Procedure: Real-time Ultrasound Guided injection of the left knee Device: Samsung HS60  Verbal informed consent obtained.  Time-out conducted.  Noted no overlying erythema, induration, or other signs of local infection.  Skin prepped in a sterile fashion.  Local anesthesia: Topical Ethyl chloride.  With sterile technique and under real time ultrasound guidance: 1 cc Kenalog 40, 2 cc lidocaine, 2 cc bupivacaine injected easily Completed without difficulty  Pain immediately resolved suggesting accurate placement of the medication.  Advised to call if fevers/chills, erythema, induration, drainage, or persistent bleeding.  Images permanently stored and available for review in the ultrasound unit.  Impression: Technically successful ultrasound guided injection.  Independent interpretation of tests performed by another provider:   None.  Impression and Recommendations:    Primary osteoarthritis of both knees Tina Boone is having recurrence of pain, she did have an MRI in 2019 that showed simply tricompartmental osteoarthritis without any meniscal tearing. We last injected Tina Boone's knee a year ago, she is having recurrence of discomfort in her left knee, repeat injection today. No mechanical symptoms, refilling Celebrex, she can return to see me as needed. Certainly PRP would be an option, viscosupplementation was unfortunately a plan exclusion.    ___________________________________________ Ihor Austin. Benjamin Stain, M.D., ABFM., CAQSM. Primary Care and Sports Medicine [facility]  Adjunct Instructor of Family Medicine  [employer]

## 2019-03-17 ENCOUNTER — Other Ambulatory Visit: Payer: Self-pay | Admitting: Physician Assistant

## 2019-03-17 DIAGNOSIS — E038 Other specified hypothyroidism: Secondary | ICD-10-CM

## 2019-03-17 DIAGNOSIS — E6609 Other obesity due to excess calories: Secondary | ICD-10-CM

## 2019-03-17 NOTE — Telephone Encounter (Signed)
Last filled 12/24/2018 #90 with no refills. Last appt 12/24/2018.

## 2019-03-18 ENCOUNTER — Encounter: Payer: Self-pay | Admitting: Physician Assistant

## 2019-03-18 NOTE — Telephone Encounter (Signed)
Hey,   Can we call the rep and give info and see if they can look into it for her?

## 2019-03-19 NOTE — Telephone Encounter (Signed)
Drug rep said he will get back to me about seeing If he can get a patient assistance form or co-pay cards

## 2019-03-19 NOTE — Telephone Encounter (Signed)
Spoke with patient and emailed coupon card for [employer] to her. She will let us know if still not affordable.

## 2019-03-31 ENCOUNTER — Other Ambulatory Visit: Payer: Self-pay | Admitting: Physician Assistant

## 2019-03-31 DIAGNOSIS — E119 Type 2 diabetes mellitus without complications: Secondary | ICD-10-CM

## 2019-04-15 ENCOUNTER — Telehealth: Payer: Self-pay | Admitting: *Deleted

## 2019-04-15 ENCOUNTER — Ambulatory Visit (INDEPENDENT_AMBULATORY_CARE_PROVIDER_SITE_OTHER): Payer: BC Managed Care – PPO | Admitting: Physician Assistant

## 2019-04-15 ENCOUNTER — Encounter: Payer: Self-pay | Admitting: Physician Assistant

## 2019-04-15 VITALS — BP 133/75 | HR 68 | Ht 67.0 in | Wt 202.0 lb

## 2019-04-15 DIAGNOSIS — E119 Type 2 diabetes mellitus without complications: Secondary | ICD-10-CM

## 2019-04-15 DIAGNOSIS — Z6833 Body mass index (BMI) 33.0-33.9, adult: Secondary | ICD-10-CM

## 2019-04-15 DIAGNOSIS — E038 Other specified hypothyroidism: Secondary | ICD-10-CM | POA: Diagnosis not present

## 2019-04-15 DIAGNOSIS — E6609 Other obesity due to excess calories: Secondary | ICD-10-CM | POA: Diagnosis not present

## 2019-04-15 DIAGNOSIS — Z1231 Encounter for screening mammogram for malignant neoplasm of breast: Secondary | ICD-10-CM

## 2019-04-15 DIAGNOSIS — E785 Hyperlipidemia, unspecified: Secondary | ICD-10-CM

## 2019-04-15 DIAGNOSIS — Z23 Encounter for immunization: Secondary | ICD-10-CM | POA: Diagnosis not present

## 2019-04-15 LAB — POCT GLYCOSYLATED HEMOGLOBIN (HGB A1C): Hemoglobin A1C: 5 % (ref 4.0–5.6)

## 2019-04-15 LAB — POCT UA - MICROALBUMIN
Albumin/Creatinine Ratio, Urine, POC: <30
Creatinine, POC: 300 mg/dL
Microalbumin Ur, POC: 10 mg/L

## 2019-04-15 MED ORDER — ATORVASTATIN CALCIUM 10 MG PO TABS: 10.0000 mg | ORAL_TABLET | Freq: Every day | ORAL | 3 refills | Status: DC

## 2019-04-15 MED ORDER — METFORMIN HCL 1000 MG PO TABS: 1000.0000 mg | ORAL_TABLET | Freq: Two times a day (BID) | ORAL | 0 refills | Status: DC

## 2019-04-15 MED ORDER — TOPIRAMATE 25 MG PO TABS: 25.0000 mg | ORAL_TABLET | Freq: Two times a day (BID) | ORAL | 3 refills | Status: DC

## 2019-04-15 MED ORDER — LEVOTHYROXINE SODIUM 112 MCG PO TABS: ORAL_TABLET | ORAL | 3 refills | Status: DC

## 2019-04-15 MED ORDER — PHENTERMINE HCL 15 MG PO CAPS: 15.0000 mg | ORAL_CAPSULE | Freq: Every morning | ORAL | 0 refills | Status: DC

## 2019-04-15 NOTE — Telephone Encounter (Signed)
LMOM notifying pt that you will order an mri and for her to follow up with you for the results.

## 2019-04-15 NOTE — Telephone Encounter (Signed)
If the popping is new we probably should pull the trigger for a new MRI,There were no meniscal tears on the last 1.

## 2019-04-15 NOTE — Progress Notes (Signed)
Subjective:    Patient ID: Tina Boone, female    DOB: Nov 16, 1975, 44 y.o.   MRN: [id number]  HPI  Pt is a 44 yo female with T2DM, HTN, hypothyroidism who presents to the clinic for 3 month follow up and medication refills.   DM- she is not checking her sugars. She is staying active and walking a lot. She tries to make good choices. She is taking synjardy but paying 150 dollars a month and cannot continue to do that. No open sores or wounds. No hypoglycemic events.   Not checking BP. Denies any CP, palpitations, headaches or vision changes. Not taking any medication right now.   Hypothyroidism- taking medication daily. No problems or concerns.   .. Active Ambulatory Problems    Diagnosis Date Noted  . Contraception management 03/06/2013  . Essential hypertension, benign 04/01/2013  . Hypothyroidism 04/03/2013  . Hypertriglyceridemia without hypercholesterolemia 09/23/2015  . Adjustment disorder with mixed anxiety and depressed mood 06/22/2016  . Controlled type 2 diabetes mellitus without complication, without long-term current use of insulin ([place]) 06/25/2017  . Class 1 obesity due to excess calories with serious comorbidity and body mass index (BMI) of 33.0 to 33.9 in adult 06/25/2017  . Primary osteoarthritis of both knees 12/10/2017  . Breast mass, right 04/02/2018  . Plantar fasciitis, right 04/07/2018   Resolved Ambulatory Problems    Diagnosis Date Noted  . Morbid obesity with BMI of 45.0-49.9, adult ([place]) 01/23/2012  . Well adult exam 01/23/2012  . Elevated blood pressure 03/06/2013  . Elevated fasting glucose 06/25/2017   No Additional Past Medical History         Review of Systems See HPI.     Objective:   Physical Exam Vitals reviewed.  Constitutional:      Appearance: Normal appearance. She is obese.  Cardiovascular:     Rate and Rhythm: Normal rate and regular rhythm.  Pulmonary:     Effort: Pulmonary effort is normal.     Breath  sounds: Normal breath sounds.  Neurological:     General: No focal deficit present.     Mental Status: She is alert and oriented to person, place, and time.  Psychiatric:        Mood and Affect: Mood normal.           Assessment & Plan:  Marland KitchenMarland KitchenJennamarie was seen today for diabetes.  Diagnoses and all orders for this visit:  Controlled type 2 diabetes mellitus without complication, without long-term current use of insulin (HCC) -     POCT glycosylated hemoglobin (Hb A1C) -     POCT UA - Microalbumin -     Pneumococcal polysaccharide vaccine 23-valent greater than or equal to 2yo subcutaneous/IM -     Ambulatory referral to Ophthalmology -     metFORMIN (GLUCOPHAGE) 1000 MG tablet; Take 1 tablet (1,000 mg total) by mouth 2 (two) times daily with a meal. -     Lipid Panel w/reflex Direct LDL -     COMPLETE METABOLIC PANEL WITH GFR  Visit for screening mammogram -     MM 3D SCREEN BREAST BILATERAL  Class 1 obesity due to excess calories with serious comorbidity and body mass index (BMI) of 33.0 to 33.9 in adult -     phentermine 15 MG capsule; Take 1 capsule (15 mg total) by mouth every morning. -     topiramate (TOPAMAX) 25 MG tablet; Take 1 tablet (25 mg total) by mouth 2 (two) times daily.  Other specified hypothyroidism -     levothyroxine (SYNTHROID) 112 MCG tablet; TAKE ONE TABLET BY MOUTH EVERY DAY BEFORE BREAKFAST. -     TSH  Hyperlipidemia LDL goal <70 -     atorvastatin (LIPITOR) 10 MG tablet; Take 1 tablet (10 mg total) by mouth daily. -     Lipid Panel w/reflex Direct LDL  Need for vaccination against Streptococcus pneumoniae -     Pneumococcal polysaccharide vaccine 23-valent greater than or equal to 2yo subcutaneous/IM   .Marland Kitchen Lab Results  Component Value Date   HGBA1C 5.0 04/15/2019    A1C great.  Due to cost and GREAT a1c stop synjardy.  Start metformin only.  BP to goal.  microalbumin normal LDL not to goal. On statin. Recheck lipid today.  Pneumonia  vaccine done today.  Referred for eye exam.   Follow up in 3 months.   TSH ordered. Will adjust accordingly.   Weight loss is stable. Still BMI of obese. Refilled phentermine and topamax. Continue to exercise and watch portion and choices.   Needs mammogram.

## 2019-04-15 NOTE — Telephone Encounter (Signed)
Pt left vm yesterday stating that her knee is bothering her again and now they are "popping repeatedly".  Please advise on her next steps.

## 2019-04-16 LAB — COMPLETE METABOLIC PANEL WITH GFR
AG Ratio: 1.7 (calc) (ref 1.0–2.5)
ALT: 15 U/L (ref 6–29)
AST: 17 U/L (ref 10–30)
Albumin: 3.8 g/dL (ref 3.6–5.1)
Alkaline phosphatase (APISO): 42 U/L (ref 31–125)
BUN: 20 mg/dL (ref 7–25)
CO2: 22 mmol/L (ref 20–32)
Calcium: 9 mg/dL (ref 8.6–10.2)
Chloride: 110 mmol/L (ref 98–110)
Creat: 0.7 mg/dL (ref 0.50–1.10)
GFR, Est African American: 123 mL/min/{1.73_m2} (ref >=60)
GFR, Est Non African American: 106 mL/min/{1.73_m2} (ref >=60)
Globulin: 2.3 g/dL (calc) (ref 1.9–3.7)
Glucose, Bld: 95 mg/dL (ref 65–99)
Potassium: 4.2 mmol/L (ref 3.5–5.3)
Sodium: 139 mmol/L (ref 135–146)
Total Bilirubin: 0.4 mg/dL (ref 0.2–1.2)
Total Protein: 6.1 g/dL (ref 6.1–8.1)

## 2019-04-16 LAB — LIPID PANEL W/REFLEX DIRECT LDL
Cholesterol: 132 mg/dL (ref <200)
HDL: 47 mg/dL — ABNORMAL LOW (ref >=50)
LDL Cholesterol (Calc): 68 mg/dL (calc)
Non-HDL Cholesterol (Calc): 85 mg/dL (calc) (ref <130)
Total CHOL/HDL Ratio: 2.8 (calc) (ref <5.0)
Triglycerides: 90 mg/dL (ref <150)

## 2019-04-16 LAB — TSH: TSH: 3.41 mIU/L

## 2019-04-16 NOTE — Progress Notes (Signed)
Markitta,   Cholesterol looks GREAT. HDL better! All those deliveries.  Kidney, liver, glucose look great.  Thyroid is in normal range.

## 2019-04-23 ENCOUNTER — Other Ambulatory Visit: Payer: Self-pay | Admitting: Physician Assistant

## 2019-04-23 DIAGNOSIS — N6489 Other specified disorders of breast: Secondary | ICD-10-CM

## 2019-05-06 ENCOUNTER — Telehealth: Payer: Self-pay | Admitting: *Deleted

## 2019-05-06 DIAGNOSIS — M170 Bilateral primary osteoarthritis of knee: Secondary | ICD-10-CM

## 2019-05-06 NOTE — Telephone Encounter (Signed)
MRI authorized. Imaging advised. Will call patient today and get her in this weekend.

## 2019-05-06 NOTE — Telephone Encounter (Signed)
MRI ordered, I am sorry, I may have had a misunderstanding.

## 2019-05-06 NOTE — Telephone Encounter (Signed)
Pt left vm this morning wanting to know why she's never been contacted to get her MRI scheduled.  She called a couple weeks ago. I looked in her chart and you never ordered it.

## 2019-05-10 ENCOUNTER — Other Ambulatory Visit: Payer: Self-pay

## 2019-05-10 ENCOUNTER — Ambulatory Visit (INDEPENDENT_AMBULATORY_CARE_PROVIDER_SITE_OTHER): Payer: BC Managed Care – PPO

## 2019-05-10 DIAGNOSIS — M170 Bilateral primary osteoarthritis of knee: Secondary | ICD-10-CM | POA: Diagnosis not present

## 2019-07-08 ENCOUNTER — Other Ambulatory Visit: Payer: Self-pay | Admitting: Physician Assistant

## 2019-07-08 DIAGNOSIS — E6609 Other obesity due to excess calories: Secondary | ICD-10-CM

## 2019-07-08 NOTE — Telephone Encounter (Signed)
Ok will fill at appt.

## 2019-07-08 NOTE — Telephone Encounter (Signed)
Last filled 04/15/2019 #90 with no refills.  Last appt 04/15/2019. She is scheduled for 07/15/2019.

## 2019-07-15 ENCOUNTER — Ambulatory Visit (INDEPENDENT_AMBULATORY_CARE_PROVIDER_SITE_OTHER): Payer: BC Managed Care – PPO | Admitting: Physician Assistant

## 2019-07-15 ENCOUNTER — Encounter: Payer: Self-pay | Admitting: Physician Assistant

## 2019-07-15 ENCOUNTER — Other Ambulatory Visit: Payer: Self-pay

## 2019-07-15 VITALS — BP 128/72 | HR 96 | Ht 67.0 in | Wt 209.0 lb

## 2019-07-15 DIAGNOSIS — Z6833 Body mass index (BMI) 33.0-33.9, adult: Secondary | ICD-10-CM | POA: Diagnosis not present

## 2019-07-15 DIAGNOSIS — E119 Type 2 diabetes mellitus without complications: Secondary | ICD-10-CM

## 2019-07-15 DIAGNOSIS — E6609 Other obesity due to excess calories: Secondary | ICD-10-CM | POA: Diagnosis not present

## 2019-07-15 LAB — POCT GLYCOSYLATED HEMOGLOBIN (HGB A1C): Hemoglobin A1C: 5 % (ref 4.0–5.6)

## 2019-07-15 MED ORDER — PHENTERMINE HCL 15 MG PO CAPS: 15.0000 mg | ORAL_CAPSULE | Freq: Every morning | ORAL | 0 refills | Status: DC

## 2019-07-15 NOTE — Progress Notes (Signed)
   Subjective:    Patient ID: Tina Boone, female    DOB: September 28, 1975, 44 y.o.   MRN: [id number]  HPI  Pt is a 44 yo  Obese female with T2DM, HTN, hypothyroidism who presents to the clinid for 3 month follow up.   DM- pt is doing well on metformin. No hypoglycemic events. No open wounds or sores. She is watching her diet and exercising regularly.   HTN- no problems or concerns. No CP, palpitations, headaches, or vision changes.   Hypothyroidism- taking synthroid. No problems or concerns.   She has gained about 6lbs but thinks it is from being less active. She plans to increase her activity. Doing well on phentermine/topamax. Denies any anxiety, insomnia, palpitations.   .. Active Ambulatory Problems    Diagnosis Date Noted  . Contraception management 03/06/2013  . Essential hypertension, benign 04/01/2013  . Hypothyroidism 04/03/2013  . Hypertriglyceridemia without hypercholesterolemia 09/23/2015  . Adjustment disorder with mixed anxiety and depressed mood 06/22/2016  . Controlled type 2 diabetes mellitus without complication, without long-term current use of insulin (HCC) 06/25/2017  . Class 1 obesity due to excess calories with serious comorbidity and body mass index (BMI) of 33.0 to 33.9 in adult 06/25/2017  . Primary osteoarthritis of both knees 12/10/2017  . Breast mass, right 04/02/2018  . Plantar fasciitis, right 04/07/2018   Resolved Ambulatory Problems    Diagnosis Date Noted  . Morbid obesity with BMI of 45.0-49.9, adult (HCC) 01/23/2012  . Well adult exam 01/23/2012  . Elevated blood pressure 03/06/2013  . Elevated fasting glucose 06/25/2017   No Additional Past Medical History       Review of Systems  All other systems reviewed and are negative.      Objective:   Physical Exam Vitals reviewed.  Constitutional:      Appearance: Normal appearance.  Cardiovascular:     Rate and Rhythm: Normal rate and regular rhythm.     Pulses: Normal pulses.   Pulmonary:     Effort: Pulmonary effort is normal.     Breath sounds: Normal breath sounds.  Neurological:     General: No focal deficit present.     Mental Status: She is alert and oriented to person, place, and time.  Psychiatric:        Mood and Affect: Mood normal.           Assessment & Plan:  Marland KitchenMarland KitchenKierria was seen today for diabetes and weight check.  Diagnoses and all orders for this visit:  Controlled type 2 diabetes mellitus without complication, without long-term current use of insulin (HCC) -     POCT glycosylated hemoglobin (Hb A1C)  Class 1 obesity due to excess calories with serious comorbidity and body mass index (BMI) of 33.0 to 33.9 in adult -     phentermine 15 MG capsule; Take 1 capsule (15 mg total) by mouth every morning.   .. Results for orders placed or performed in visit on 07/15/19  POCT glycosylated hemoglobin (Hb A1C)  Result Value Ref Range   Hemoglobin A1C 5.0 4.0 - 5.6 %   HbA1c POC (<> result, manual entry)     HbA1c, POC (prediabetic range)     HbA1c, POC (controlled diabetic range)     A!C stable and great.  BP controlled and to goal.  On statin.  Needs eye exam.  UTD foot exam/Vaccines.   Continue phentermine and topamax. Discussed exercise and diet.   Follow up in 3 months.

## 2019-07-22 ENCOUNTER — Other Ambulatory Visit: Payer: Self-pay | Admitting: Physician Assistant

## 2019-07-22 DIAGNOSIS — E119 Type 2 diabetes mellitus without complications: Secondary | ICD-10-CM

## 2019-10-03 ENCOUNTER — Other Ambulatory Visit: Payer: Self-pay | Admitting: Physician Assistant

## 2019-10-03 DIAGNOSIS — E6609 Other obesity due to excess calories: Secondary | ICD-10-CM

## 2019-10-05 NOTE — Telephone Encounter (Signed)
Last written 07/15/2019 #90 with no refills Last appt 07/15/2019, was to follow up in 3 months, scheduled 10/14/2019

## 2019-10-13 ENCOUNTER — Other Ambulatory Visit: Payer: Self-pay | Admitting: Physician Assistant

## 2019-10-13 DIAGNOSIS — E119 Type 2 diabetes mellitus without complications: Secondary | ICD-10-CM

## 2019-10-14 ENCOUNTER — Ambulatory Visit: Payer: BC Managed Care – PPO | Admitting: Physician Assistant

## 2019-10-28 ENCOUNTER — Telehealth: Payer: Self-pay | Admitting: Neurology

## 2019-10-28 ENCOUNTER — Other Ambulatory Visit: Payer: Self-pay

## 2019-10-28 ENCOUNTER — Ambulatory Visit (INDEPENDENT_AMBULATORY_CARE_PROVIDER_SITE_OTHER): Payer: BC Managed Care – PPO | Admitting: Physician Assistant

## 2019-10-28 ENCOUNTER — Encounter: Payer: Self-pay | Admitting: Physician Assistant

## 2019-10-28 VITALS — BP 138/80 | HR 82 | Ht 67.0 in | Wt 223.0 lb

## 2019-10-28 DIAGNOSIS — Z23 Encounter for immunization: Secondary | ICD-10-CM | POA: Diagnosis not present

## 2019-10-28 DIAGNOSIS — M722 Plantar fascial fibromatosis: Secondary | ICD-10-CM

## 2019-10-28 DIAGNOSIS — E1169 Type 2 diabetes mellitus with other specified complication: Secondary | ICD-10-CM | POA: Diagnosis not present

## 2019-10-28 DIAGNOSIS — Z6834 Body mass index (BMI) 34.0-34.9, adult: Secondary | ICD-10-CM

## 2019-10-28 DIAGNOSIS — E6609 Other obesity due to excess calories: Secondary | ICD-10-CM

## 2019-10-28 LAB — POCT GLYCOSYLATED HEMOGLOBIN (HGB A1C): Hemoglobin A1C: 5.2 % (ref 4.0–5.6)

## 2019-10-28 MED ORDER — DICLOFENAC SODIUM 75 MG PO TBEC: 75.0000 mg | DELAYED_RELEASE_TABLET | Freq: Two times a day (BID) | ORAL | 5 refills | Status: DC

## 2019-10-28 MED ORDER — METFORMIN HCL 1000 MG PO TABS: 1000.0000 mg | ORAL_TABLET | Freq: Two times a day (BID) | ORAL | 1 refills | Status: DC

## 2019-10-28 MED ORDER — PHENTERMINE HCL 15 MG PO CAPS: 15.0000 mg | ORAL_CAPSULE | Freq: Every morning | ORAL | 0 refills | Status: DC

## 2019-10-28 NOTE — Telephone Encounter (Signed)
LMOM for patient to call back about medication advise. We need to make sure she is aware that she can take celexbrex or the new diclofenac that [facility] sent to the pharmacy but not both. Awaiting call back.

## 2019-10-28 NOTE — Progress Notes (Signed)
Subjective:    Patient ID: Ressie Slevin, female    DOB: Jun 08, 1975, 44 y.o.   MRN: [id number]  HPI  Pt is a 44 yo obese female with hypothyroidism, HTN, T2DM who presents to the clinic for medication refills.   Patient is currently taking phentermine and Topamax for weight loss.  She is actually had a weight gain over the past 3 months.  She contributes this to her bilateral plantar fasciitis pain.  She does feel like it is worsening.  She does work for [employer] and on her feet walking and moving all day long.  She does wear good supportive tennis shoes.  The pain is getting rather bothersome and just hurtful to walk in general.  She is taking a lot of ibuprofen with minimal benefits.  She is trying to do some stretches but they do not seem to be very organized.  She does not have any orthotics.  Patient does not check her sugars.  She would like to stay on the Metformin.  She denies any hypoglycemic events.  She denies any open wounds or sores.  .. Active Ambulatory Problems    Diagnosis Date Noted  . Contraception management 03/06/2013  . Essential hypertension, benign 04/01/2013  . Hypothyroidism 04/03/2013  . Hypertriglyceridemia without hypercholesterolemia 09/23/2015  . Adjustment disorder with mixed anxiety and depressed mood 06/22/2016  . Controlled type 2 diabetes mellitus without complication, without long-term current use of insulin (HCC) 06/25/2017  . Class 1 obesity due to excess calories with serious comorbidity and body mass index (BMI) of 33.0 to 33.9 in adult 06/25/2017  . Primary osteoarthritis of both knees 12/10/2017  . Breast mass, right 04/02/2018  . Bilateral plantar fasciitis 04/07/2018   Resolved Ambulatory Problems    Diagnosis Date Noted  . Morbid obesity with BMI of 45.0-49.9, adult (HCC) 01/23/2012  . Well adult exam 01/23/2012  . Elevated blood pressure 03/06/2013  . Elevated fasting glucose 06/25/2017   No Additional Past Medical History       Review of Systems  All other systems reviewed and are negative.      Objective:   Physical Exam Vitals reviewed.  Constitutional:      Appearance: Normal appearance. She is obese.  Cardiovascular:     Rate and Rhythm: Normal rate and regular rhythm.     Pulses: Normal pulses.     Heart sounds: Normal heart sounds.  Pulmonary:     Effort: Pulmonary effort is normal.  Musculoskeletal:     Comments: Hard to place complete weight when starting on both feet.   Neurological:     General: No focal deficit present.     Mental Status: She is alert and oriented to person, place, and time.  Psychiatric:        Mood and Affect: Mood normal.           Assessment & Plan:  Marland KitchenMarland KitchenRakia was seen today for diabetes.  Diagnoses and all orders for this visit:  Controlled type 2 diabetes mellitus with other specified complication, without long-term current use of insulin (HCC) -     metFORMIN (GLUCOPHAGE) 1000 MG tablet; Take 1 tablet (1,000 mg total) by mouth 2 (two) times daily with a meal.  Class 1 obesity due to excess calories with serious comorbidity and body mass index (BMI) of 34.0 to 34.9 in adult -     phentermine 15 MG capsule; Take 1 capsule (15 mg total) by mouth every morning.  Bilateral plantar fasciitis -  diclofenac (VOLTAREN) 75 MG EC tablet; Take 1 tablet (75 mg total) by mouth 2 (two) times daily. -     Ambulatory referral to Podiatry   Discussed weight gain with patient.  Discussed ways to target nutrition.  Obviously her movement and exercise is decreased on because of her plantar fasciitis.  I would like for her to see podiatry to consider orthotics.  I did give her some organized exercises and stretches.  Encouraged to phone lower.  I did switch out her over-the-counter ibuprofen with diclofenac twice a day.  Encourage lots of icing of her plantar foot.  Do not walk without shoes at this point.  Rest when you can. Made sure to communicate with patient she  can take celebrex or diclofenac but not both so see which one works better.   A1c is great at 5.2.  Refilled Metformin Blood pressure did improve at 138/80. On statin.   Hypothyroidism-no need for refills.   Refilled medications Follow-up in 3 months

## 2019-10-28 NOTE — Patient Instructions (Signed)
Plantar Fasciitis  Plantar fasciitis is a painful foot condition that affects the heel. It occurs when the band of tissue that connects the toes to the heel bone (plantar fascia) becomes irritated. This can happen as the result of exercising too much or doing other repetitive activities (overuse injury). The pain from plantar fasciitis can range from mild irritation to severe pain that makes it difficult to walk or move. The pain is usually worse in the morning after sleeping, or after sitting or lying down for a while. Pain may also be worse after long periods of walking or standing. What are the causes? This condition may be caused by:  Standing for long periods of time.  Wearing shoes that do not have good arch support.  Doing activities that put stress on joints (high-impact activities), including running, aerobics, and ballet.  Being overweight.  An abnormal way of walking (gait).  Tight muscles in the back of your lower leg (calf).  High arches in your feet.  Starting a new athletic activity. What are the signs or symptoms? The main symptom of this condition is heel pain. Pain may:  Be worse with first steps after a time of rest, especially in the morning after sleeping or after you have been sitting or lying down for a while.  Be worse after long periods of standing still.  Decrease after 30-45 minutes of activity, such as gentle walking. How is this diagnosed? This condition may be diagnosed based on your medical history and your symptoms. Your health care provider may ask questions about your activity level. Your health care provider will do a physical exam to check for:  A tender area on the bottom of your foot.  A high arch in your foot.  Pain when you move your foot.  Difficulty moving your foot. You may have imaging tests to confirm the diagnosis, such as:  X-rays.  Ultrasound.  MRI. How is this treated? Treatment for plantar fasciitis depends on how  severe your condition is. Treatment may include:  Rest, ice, applying pressure (compression), and raising the affected foot (elevation). This may be called RICE therapy. Your health care provider may recommend RICE therapy along with over-the-counter pain medicines to manage your pain.  Exercises to stretch your calves and your plantar fascia.  A splint that holds your foot in a stretched, upward position while you sleep (night splint).  Physical therapy to relieve symptoms and prevent problems in the future.  Injections of steroid medicine (cortisone) to relieve pain and inflammation.  Stimulating your plantar fascia with electrical impulses (extracorporeal shock wave therapy). This is usually the last treatment option before surgery.  Surgery, if other treatments have not worked after 12 months. Follow these instructions at home:  Managing pain, stiffness, and swelling  If directed, put ice on the painful area: ? Put ice in a plastic bag, or use a frozen bottle of water. ? Place a towel between your skin and the bag or bottle. ? Roll the bottom of your foot over the bag or bottle. ? Do this for 20 minutes, 2-3 times a day.  Wear athletic shoes that have air-sole or gel-sole cushions, or try wearing soft shoe inserts that are designed for plantar fasciitis.  Raise (elevate) your foot above the level of your heart while you are sitting or lying down. Activity  Avoid activities that cause pain. Ask your health care provider what activities are safe for you.  Do physical therapy exercises and stretches as told   by your health care provider.  Try activities and forms of exercise that are easier on your joints (low-impact). Examples include swimming, water aerobics, and biking. General instructions  Take over-the-counter and prescription medicines only as told by your health care provider.  Wear a night splint while sleeping, if told by your health care provider. Loosen the splint  if your toes tingle, become numb, or turn cold and blue.  Maintain a healthy weight, or work with your health care provider to lose weight as needed.  Keep all follow-up visits as told by your health care provider. This is important. Contact a health care provider if you:  Have symptoms that do not go away after caring for yourself at home.  Have pain that gets worse.  Have pain that affects your ability to move or do your daily activities. Summary  Plantar fasciitis is a painful foot condition that affects the heel. It occurs when the band of tissue that connects the toes to the heel bone (plantar fascia) becomes irritated.  The main symptom of this condition is heel pain that may be worse after exercising too much or standing still for a long time.  Treatment varies, but it usually starts with rest, ice, compression, and elevation (RICE therapy) and over-the-counter medicines to manage pain. This information is not intended to replace advice given to you by your health care provider. Make sure you discuss any questions you have with your health care provider. Document Revised: 01/04/2017 Document Reviewed: 11/19/2016 Elsevier Patient Education  2020 [employer]  

## 2019-10-29 NOTE — Telephone Encounter (Signed)
Patient made aware and will not take together.

## 2019-11-12 ENCOUNTER — Ambulatory Visit (INDEPENDENT_AMBULATORY_CARE_PROVIDER_SITE_OTHER): Payer: BC Managed Care – PPO

## 2019-11-12 ENCOUNTER — Ambulatory Visit (INDEPENDENT_AMBULATORY_CARE_PROVIDER_SITE_OTHER): Payer: BC Managed Care – PPO | Admitting: Podiatry

## 2019-11-12 ENCOUNTER — Other Ambulatory Visit: Payer: Self-pay

## 2019-11-12 DIAGNOSIS — M79672 Pain in left foot: Secondary | ICD-10-CM | POA: Diagnosis not present

## 2019-11-12 DIAGNOSIS — M79671 Pain in right foot: Secondary | ICD-10-CM | POA: Diagnosis not present

## 2019-11-12 DIAGNOSIS — M722 Plantar fascial fibromatosis: Secondary | ICD-10-CM | POA: Diagnosis not present

## 2019-11-12 NOTE — Patient Instructions (Signed)

## 2019-11-12 NOTE — Progress Notes (Signed)
Subjective:   Patient ID: Tina Boone, female   DOB: 44 y.o.   MRN: [id number]   HPI 44 year old female presents the office today for concerns of bilateral heel pain which is now been ongoing for several months.  She denies recent injury or trauma.  This started in the right side and then moved to the left.  She has tried on a tennis ball as well as a spike ball in the bottom of the foot which helps some.  No recent injury or falls.  No rating pain or weakness.  No other concerns today.   Review of Systems  All other systems reviewed and are negative.  No past medical history on file.  Past Surgical History:  Procedure Laterality Date  . CHOLECYSTECTOMY  2009  . TONSILECTOMY/ADENOIDECTOMY WITH MYRINGOTOMY  12/2010     Current Outpatient Medications:  .  acetaminophen (TYLENOL) 650 MG CR tablet, Take 1 tablet (650 mg total) by mouth every 8 (eight) hours as needed for pain., Disp: 90 tablet, Rfl: 3 .  atorvastatin (LIPITOR) 10 MG tablet, Take 1 tablet (10 mg total) by mouth daily., Disp: 90 tablet, Rfl: 3 .  calcium-vitamin D (OSCAL WITH D) 250-125 MG-UNIT tablet, Take 1 tablet by mouth daily., Disp: , Rfl:  .  diclofenac (VOLTAREN) 75 MG EC tablet, Take 1 tablet (75 mg total) by mouth 2 (two) times daily., Disp: 60 tablet, Rfl: 5 .  GLUCOSAMINE-CHONDROITIN PO, Take by mouth., Disp: , Rfl:  .  levothyroxine (SYNTHROID) 112 MCG tablet, TAKE ONE TABLET BY MOUTH EVERY DAY BEFORE BREAKFAST., Disp: 90 tablet, Rfl: 3 .  metFORMIN (GLUCOPHAGE) 1000 MG tablet, Take 1 tablet (1,000 mg total) by mouth 2 (two) times daily with a meal., Disp: 180 tablet, Rfl: 1 .  MICROGESTIN FE 1.5/30 1.5-30 MG-MCG tablet, Take 1 tablet by mouth daily., Disp: 28 tablet, Rfl: 11 .  norethindrone-ethinyl estradiol-iron (LOESTRIN FE) 1.5-30 MG-MCG tablet, Take 1 tablet by mouth daily., Disp: , Rfl:  .  Omega-3 Fatty Acids (FISH OIL PO), Take by mouth., Disp: , Rfl:  .  phentermine 15 MG capsule, Take 1  capsule (15 mg total) by mouth every morning., Disp: 90 capsule, Rfl: 0 .  topiramate (TOPAMAX) 25 MG tablet, Take 1 tablet (25 mg total) by mouth 2 (two) times daily., Disp: 180 tablet, Rfl: 3  No Known Allergies        Objective:  Physical Exam  General: AAO x3, NAD  Dermatological: Skin is warm, dry and supple bilateral.  There are no open sores, no preulcerative lesions, no rash or signs of infection present.  Vascular: Dorsalis Pedis artery and Posterior Tibial artery pedal pulses are 2/4 bilateral with immedate capillary fill time. . There is no pain with calf compression, swelling, warmth, erythema.   Neruologic: Grossly intact via light touch bilateral. Negative tinel sign  Musculoskeletal: Tenderness to palpation along the plantar medial tubercle of the calcaneus at the insertion of plantar fascia on the right and left foot. There is no pain along the course of the plantar fascia within the arch of the foot. Plantar fascia appears to be intact. There is no pain with lateral compression of the calcaneus or pain with vibratory sensation. There is no pain along the course or insertion of the achilles tendon. No other areas of tenderness to bilateral lower extremities. Muscular strength 5/5 in all groups tested bilateral.  Gait: Unassisted, Nonantalgic.       Assessment:   44 year old female bilateral plantar fasciitis  Plan:  -Treatment options discussed including all alternatives, risks, and complications -Etiology of symptoms were discussed -X-rays were obtained and reviewed with the patient.  No evidence of acute fracture or stress fracture identified today. -Steroid injections performed bilaterally.  See procedure note below -Continue current anti-inflammatories -Plantar fascial braces dispensed x2 -Stretching, icing daily. -Discussed shoe modifications and orthoticsg  Procedure: Injection Tendon/Ligament Discussed alternatives, risks, complications and verbal  consent was obtained.  Location: Bilateral plantar fascia at the glabrous junction; medial approach. Skin Prep: Alcohol  Injectate: 0.5cc 0.5% marcaine plain, 0.5 cc 2% lidocaine plain and, 1 cc kenalog 10. Disposition: Patient tolerated procedure well. Injection site dressed with a band-aid.  Post-injection care was discussed and return precautions discussed.   Return in about 4 weeks (around 12/10/2019).  Vivi Barrack DPM

## 2019-12-17 ENCOUNTER — Other Ambulatory Visit: Payer: Self-pay

## 2019-12-17 ENCOUNTER — Ambulatory Visit (INDEPENDENT_AMBULATORY_CARE_PROVIDER_SITE_OTHER): Payer: BC Managed Care – PPO | Admitting: Podiatry

## 2019-12-17 DIAGNOSIS — M79672 Pain in left foot: Secondary | ICD-10-CM | POA: Diagnosis not present

## 2019-12-17 DIAGNOSIS — M79671 Pain in right foot: Secondary | ICD-10-CM | POA: Diagnosis not present

## 2019-12-17 DIAGNOSIS — M722 Plantar fascial fibromatosis: Secondary | ICD-10-CM

## 2019-12-17 NOTE — Patient Instructions (Signed)
For instructions on how to put on your Night Splint, please visit [email or web address]   Plantar Fasciitis (Heel Spur Syndrome) with Rehab The plantar fascia is a fibrous, ligament-like, soft-tissue structure that spans the bottom of the foot. Plantar fasciitis is a condition that causes pain in the foot due to inflammation of the tissue. SYMPTOMS   Pain and tenderness on the underneath side of the foot.  Pain that worsens with standing or walking. CAUSES  Plantar fasciitis is caused by irritation and injury to the plantar fascia on the underneath side of the foot. Common mechanisms of injury include:  Direct trauma to bottom of the foot.  Damage to a small nerve that runs under the foot where the main fascia attaches to the heel bone.  Stress placed on the plantar fascia due to bone spurs. RISK INCREASES WITH:   Activities that place stress on the plantar fascia (running, jumping, pivoting, or cutting).  Poor strength and flexibility.  Improperly fitted shoes.  Tight calf muscles.  Flat feet.  Failure to warm-up properly before activity.  Obesity. PREVENTION  Warm up and stretch properly before activity.  Allow for adequate recovery between workouts.  Maintain physical fitness:  Strength, flexibility, and endurance.  Cardiovascular fitness.  Maintain a health body weight.  Avoid stress on the plantar fascia.  Wear properly fitted shoes, including arch supports for individuals who have flat feet.  PROGNOSIS  If treated properly, then the symptoms of plantar fasciitis usually resolve without surgery. However, occasionally surgery is necessary.  RELATED COMPLICATIONS   Recurrent symptoms that may result in a chronic condition.  Problems of the lower back that are caused by compensating for the injury, such as limping.  Pain or weakness of the foot during push-off following surgery.  Chronic inflammation, scarring, and partial or complete fascia tear,  occurring more often from repeated injections.  TREATMENT  Treatment initially involves the use of ice and medication to help reduce pain and inflammation. The use of strengthening and stretching exercises may help reduce pain with activity, especially stretches of the Achilles tendon. These exercises may be performed at home or with a therapist. Your caregiver may recommend that you use heel cups of arch supports to help reduce stress on the plantar fascia. Occasionally, corticosteroid injections are given to reduce inflammation. If symptoms persist for greater than 6 months despite non-surgical (conservative), then surgery may be recommended.   MEDICATION   If pain medication is necessary, then nonsteroidal anti-inflammatory medications, such as aspirin and ibuprofen, or other minor pain relievers, such as acetaminophen, are often recommended.  Do not take pain medication within 7 days before surgery.  Prescription pain relievers may be given if deemed necessary by your caregiver. Use only as directed and only as much as you need.  Corticosteroid injections may be given by your caregiver. These injections should be reserved for the most serious cases, because they may only be given a certain number of times.  HEAT AND COLD  Cold treatment (icing) relieves pain and reduces inflammation. Cold treatment should be applied for 10 to 15 minutes every 2 to 3 hours for inflammation and pain and immediately after any activity that aggravates your symptoms. Use ice packs or massage the area with a piece of ice (ice massage).  Heat treatment may be used prior to performing the stretching and strengthening activities prescribed by your caregiver, physical therapist, or athletic trainer. Use a heat pack or soak the injury in warm water.  SEEK IMMEDIATE MEDICAL CARE   IF:  Treatment seems to offer no benefit, or the condition worsens.  Any medications produce adverse side effects.  EXERCISES- RANGE OF  MOTION (ROM) AND STRETCHING EXERCISES - Plantar Fasciitis (Heel Spur Syndrome) These exercises may help you when beginning to rehabilitate your injury. Your symptoms may resolve with or without further involvement from your physician, physical therapist or athletic trainer. While completing these exercises, remember:   Restoring tissue flexibility helps normal motion to return to the joints. This allows healthier, less painful movement and activity.  An effective stretch should be held for at least 30 seconds.  A stretch should never be painful. You should only feel a gentle lengthening or release in the stretched tissue.  RANGE OF MOTION - Toe Extension, Flexion  Sit with your right / left leg crossed over your opposite knee.  Grasp your toes and gently pull them back toward the top of your foot. You should feel a stretch on the bottom of your toes and/or foot.  Hold this stretch for 10 seconds.  Now, gently pull your toes toward the bottom of your foot. You should feel a stretch on the top of your toes and or foot.  Hold this stretch for 10 seconds. Repeat  times. Complete this stretch 3 times per day.   RANGE OF MOTION - Ankle Dorsiflexion, Active Assisted  Remove shoes and sit on a chair that is preferably not on a carpeted surface.  Place right / left foot under knee. Extend your opposite leg for support.  Keeping your heel down, slide your right / left foot back toward the chair until you feel a stretch at your ankle or calf. If you do not feel a stretch, slide your bottom forward to the edge of the chair, while still keeping your heel down.  Hold this stretch for 10 seconds. Repeat 3 times. Complete this stretch 2 times per day.   STRETCH  Gastroc, Standing  Place hands on wall.  Extend right / left leg, keeping the front knee somewhat bent.  Slightly point your toes inward on your back foot.  Keeping your right / left heel on the floor and your knee straight, shift  your weight toward the wall, not allowing your back to arch.  You should feel a gentle stretch in the right / left calf. Hold this position for 10 seconds. Repeat 3 times. Complete this stretch 2 times per day.  STRETCH  Soleus, Standing  Place hands on wall.  Extend right / left leg, keeping the other knee somewhat bent.  Slightly point your toes inward on your back foot.  Keep your right / left heel on the floor, bend your back knee, and slightly shift your weight over the back leg so that you feel a gentle stretch deep in your back calf.  Hold this position for 10 seconds. Repeat 3 times. Complete this stretch 2 times per day.  STRETCH  Gastrocsoleus, Standing  Note: This exercise can place a lot of stress on your foot and ankle. Please complete this exercise only if specifically instructed by your caregiver.   Place the ball of your right / left foot on a step, keeping your other foot firmly on the same step.  Hold on to the wall or a rail for balance.  Slowly lift your other foot, allowing your body weight to press your heel down over the edge of the step.  You should feel a stretch in your right / left calf.  Hold this position   for 10 seconds.  Repeat this exercise with a slight bend in your right / left knee. Repeat 3 times. Complete this stretch 2 times per day.   STRENGTHENING EXERCISES - Plantar Fasciitis (Heel Spur Syndrome)  These exercises may help you when beginning to rehabilitate your injury. They may resolve your symptoms with or without further involvement from your physician, physical therapist or athletic trainer. While completing these exercises, remember:   Muscles can gain both the endurance and the strength needed for everyday activities through controlled exercises.  Complete these exercises as instructed by your physician, physical therapist or athletic trainer. Progress the resistance and repetitions only as guided.  STRENGTH - Towel Curls  Sit in  a chair positioned on a non-carpeted surface.  Place your foot on a towel, keeping your heel on the floor.  Pull the towel toward your heel by only curling your toes. Keep your heel on the floor. Repeat 3 times. Complete this exercise 2 times per day.  STRENGTH - Ankle Inversion  Secure one end of a rubber exercise band/tubing to a fixed object (table, pole). Loop the other end around your foot just before your toes.  Place your fists between your knees. This will focus your strengthening at your ankle.  Slowly, pull your big toe up and in, making sure the band/tubing is positioned to resist the entire motion.  Hold this position for 10 seconds.  Have your muscles resist the band/tubing as it slowly pulls your foot back to the starting position. Repeat 3 times. Complete this exercises 2 times per day.  Document Released: 01/22/2005 Document Revised: 04/16/2011 Document Reviewed: 05/06/2008 [employer] Patient Information 2014 [employer].  

## 2019-12-22 NOTE — Progress Notes (Signed)
Subjective: 44 year old female presents the office today for follow-up evaluation of plantar fasciitis.  She said that she is doing somewhat better.  She still taking anti-inflammatories which is been helpful.  She is putting pressure brace for couple days for the injection was also helpful and she had no complications after this.  She gets pain in the morning when she first gets up.  She has no other concerns today.Denies any systemic complaints such as fevers, chills, nausea, vomiting. No acute changes since last appointment, and no other complaints at this time.   Objective: AAO x3, NAD DP/PT pulses palpable bilaterally, CRT less than 3 seconds There is improved is still mild tenderness palpation along the plantar medial tubercle of the calcaneus at the insertion of plantar fascia and slightly along the arch of the foot with the left side worse than right..  The plantar fascial appears to be intact.  There is no pain with lateral compression of calcaneus.  No pain to the Achilles tendon.  No pain with calf compression, swelling, warmth, erythema  Assessment: 44 year old female plantar fasciitis  Plan: -All treatment options discussed with the patient including all alternatives, risks, complications.  -Overall she is doing better.  Continue stretching, icing daily.  Dispensed a night splint.  Discussed shoe modifications and orthotics.  Anti-inflammatories as needed. -Patient encouraged to call the office with any questions, concerns, change in symptoms.   Return if symptoms worsen or fail to improve.  Vivi Barrack DPM

## 2020-01-24 ENCOUNTER — Emergency Department (INDEPENDENT_AMBULATORY_CARE_PROVIDER_SITE_OTHER)
Admission: EM | Admit: 2020-01-24 | Discharge: 2020-01-24 | Disposition: A | Discharge status: Home / Self Care | Payer: BC Managed Care – PPO | Source: Home / Self Care

## 2020-01-24 ENCOUNTER — Other Ambulatory Visit: Payer: Self-pay

## 2020-01-24 ENCOUNTER — Emergency Department: Admit: 2020-01-24 | Discharge status: Against Medical Advice | Payer: Self-pay

## 2020-01-24 DIAGNOSIS — J069 Acute upper respiratory infection, unspecified: Secondary | ICD-10-CM | POA: Diagnosis not present

## 2020-01-24 HISTORY — DX: Hyperlipidemia, unspecified: E78.5

## 2020-01-24 HISTORY — DX: Type 2 diabetes mellitus without complications: E11.9

## 2020-01-24 HISTORY — DX: Hypothyroidism, unspecified: E03.9

## 2020-01-24 MED ORDER — AMOXICILLIN 875 MG PO TABS: 875.0000 mg | ORAL_TABLET | Freq: Two times a day (BID) | ORAL | 0 refills | Status: DC

## 2020-01-24 MED ORDER — FLUTICASONE PROPIONATE 50 MCG/ACT NA SUSP: 2.0000 | Freq: Every day | NASAL | 0 refills | Status: DC

## 2020-01-24 NOTE — Discharge Instructions (Addendum)
Drink plenty of fluids Use Flonase twice a day until symptoms improve Your symptoms should go away, like a, cough, in a few days Fill and take the antibiotic if you fail to improve, or have significant worsening symptoms at 7 to 10 days

## 2020-01-24 NOTE — ED Triage Notes (Signed)
Pt presents for presumed sinus infection. Headache, nasal congestion, post nasal drip, and coughing. OTC tylenol and nyquil. Symptoms onset Friday 12/17. Pt is vaccinated for flu but not covid.

## 2020-01-26 ENCOUNTER — Telehealth: Payer: Self-pay

## 2020-01-26 NOTE — Telephone Encounter (Signed)
Pt called asking about lab results. Still in process. Advised to check mychart frequently. Assured results will show up on mychart once resulted.

## 2020-01-27 ENCOUNTER — Ambulatory Visit: Payer: BC Managed Care – PPO | Admitting: Physician Assistant

## 2020-01-27 LAB — NOVEL CORONAVIRUS, NAA: SARS-CoV-2, NAA: DETECTED — AB

## 2020-01-27 LAB — SARS-COV-2, NAA 2 DAY TAT

## 2020-02-01 ENCOUNTER — Other Ambulatory Visit: Payer: Self-pay | Admitting: Physician Assistant

## 2020-02-01 DIAGNOSIS — Z3009 Encounter for other general counseling and advice on contraception: Secondary | ICD-10-CM

## 2020-02-03 ENCOUNTER — Encounter: Payer: Self-pay | Admitting: Physician Assistant

## 2020-02-03 ENCOUNTER — Other Ambulatory Visit: Payer: Self-pay

## 2020-02-03 ENCOUNTER — Ambulatory Visit (INDEPENDENT_AMBULATORY_CARE_PROVIDER_SITE_OTHER): Payer: BC Managed Care – PPO | Admitting: Physician Assistant

## 2020-02-03 VITALS — BP 138/85 | HR 88 | Ht 67.0 in | Wt 227.0 lb

## 2020-02-03 DIAGNOSIS — E6609 Other obesity due to excess calories: Secondary | ICD-10-CM

## 2020-02-03 DIAGNOSIS — E1165 Type 2 diabetes mellitus with hyperglycemia: Secondary | ICD-10-CM | POA: Diagnosis not present

## 2020-02-03 DIAGNOSIS — Z6834 Body mass index (BMI) 34.0-34.9, adult: Secondary | ICD-10-CM

## 2020-02-03 LAB — POCT GLYCOSYLATED HEMOGLOBIN (HGB A1C): Hemoglobin A1C: 5.4 % (ref 4.0–5.6)

## 2020-02-03 MED ORDER — SYNJARDY XR 10-1000 MG PO TB24: 1.0000 | ORAL_TABLET | Freq: Every day | ORAL | 2 refills | Status: DC

## 2020-02-03 MED ORDER — PHENTERMINE HCL 15 MG PO CAPS: 15.0000 mg | ORAL_CAPSULE | Freq: Every morning | ORAL | 0 refills | Status: DC

## 2020-02-03 MED ORDER — TRULICITY 0.75 MG/0.5ML ~~LOC~~ SOAJ: 0.7500 mg | SUBCUTANEOUS | 2 refills | Status: DC

## 2020-02-03 NOTE — Progress Notes (Signed)
Subjective:    Patient ID: Tina Boone, female    DOB: 24-Oct-1975, 44 y.o.   MRN: [id number]  HPI  Pt is a 44 yo female with T2DM, HTN, HLD, hypothyroidism who presents to the clinic for medication follow up.   Pt has been sick with covid for the last 2 weeks and gained 7lbs. She is feeling better now.   She was stepped down from synjardy to metformin due to cost issues. She would like to go back to her normal medications.  She is not checking sugars. She denies any open sores or wounds. She does continue to take topamax and phentermine for weight loss. She denies any hypoglycemia.   .. Active Ambulatory Problems    Diagnosis Date Noted  . Contraception management 03/06/2013  . Essential hypertension, benign 04/01/2013  . Hypothyroidism 04/03/2013  . Hypertriglyceridemia without hypercholesterolemia 09/23/2015  . Adjustment disorder with mixed anxiety and depressed mood 06/22/2016  . Controlled type 2 diabetes mellitus without complication, without long-term current use of insulin (HCC) 06/25/2017  . Class 1 obesity due to excess calories with serious comorbidity and body mass index (BMI) of 33.0 to 33.9 in adult 06/25/2017  . Primary osteoarthritis of both knees 12/10/2017  . Breast mass, right 04/02/2018  . Bilateral plantar fasciitis 04/07/2018   Resolved Ambulatory Problems    Diagnosis Date Noted  . Morbid obesity with BMI of 45.0-49.9, adult (HCC) 01/23/2012  . Well adult exam 01/23/2012  . Elevated blood pressure 03/06/2013  . Elevated fasting glucose 06/25/2017   Past Medical History:  Diagnosis Date  . Diabetes (HCC)   . Hyperlipidemia   . Hypothyroid       Review of Systems  All other systems reviewed and are negative.      Objective:   Physical Exam Vitals reviewed.  Constitutional:      Appearance: Normal appearance.  HENT:     Head: Normocephalic.  Cardiovascular:     Rate and Rhythm: Normal rate and regular rhythm.     Pulses: Normal  pulses.     Heart sounds: Normal heart sounds.  Pulmonary:     Effort: Pulmonary effort is normal.     Breath sounds: Normal breath sounds.  Neurological:     General: No focal deficit present.     Mental Status: She is alert and oriented to person, place, and time.  Psychiatric:        Mood and Affect: Mood normal.        Behavior: Behavior normal.           Assessment & Plan:  Marland KitchenMarland KitchenEllarae was seen today for diabetes.  Diagnoses and all orders for this visit:  Uncontrolled type 2 diabetes mellitus with hyperglycemia (HCC) -     POCT HgB A1C -     Empagliflozin-metFORMIN HCl ER (SYNJARDY XR) 11-998 MG TB24; Take 1 tablet by mouth daily. -     Dulaglutide (TRULICITY) 0.75 MG/0.5ML SOPN; Inject 0.75 mg into the skin once a week.  Class 1 obesity due to excess calories with serious comorbidity and body mass index (BMI) of 34.0 to 34.9 in adult -     phentermine 15 MG capsule; Take 1 capsule (15 mg total) by mouth every morning.   .. Lab Results  Component Value Date   HGBA1C 5.4 02/03/2020   A1C stable.  Continue on medications she was previously on but added back in synjardy.  On ACE.  On STATIN.  Eye exam UTD.  Foot exam  UTD.  Flu shot done. Pneumonia shot done.  Declines covid vaccine.   Labs UTD.   Follow up in 3 months.

## 2020-02-08 ENCOUNTER — Encounter: Payer: Self-pay | Admitting: Physician Assistant

## 2020-03-10 ENCOUNTER — Other Ambulatory Visit: Payer: Self-pay | Admitting: Sports Medicine

## 2020-03-10 DIAGNOSIS — M170 Bilateral primary osteoarthritis of knee: Secondary | ICD-10-CM

## 2020-03-17 ENCOUNTER — Other Ambulatory Visit: Payer: Self-pay

## 2020-03-17 ENCOUNTER — Ambulatory Visit (INDEPENDENT_AMBULATORY_CARE_PROVIDER_SITE_OTHER): Payer: 59 | Admitting: Sports Medicine

## 2020-03-17 ENCOUNTER — Ambulatory Visit (INDEPENDENT_AMBULATORY_CARE_PROVIDER_SITE_OTHER): Payer: 59

## 2020-03-17 DIAGNOSIS — M170 Bilateral primary osteoarthritis of knee: Secondary | ICD-10-CM

## 2020-03-17 NOTE — Progress Notes (Signed)
    Procedures performed today:    Procedure: Real-time Ultrasound Guided injection of the left knee Device: Samsung HS60  Verbal informed consent obtained.  Time-out conducted.  Noted no overlying erythema, induration, or other signs of local infection.  Skin prepped in a sterile fashion.  Local anesthesia: Topical Ethyl chloride.  With sterile technique and under real time ultrasound guidance:  Noted trace effusion, 1 cc Kenalog 40, 2 cc lidocaine, 2 cc bupivacaine injected easily Completed without difficulty  Advised to call if fevers/chills, erythema, induration, drainage, or persistent bleeding.  Images permanently stored and available for review in PACS.  Impression: Technically successful ultrasound guided injection.  Procedure: Real-time Ultrasound Guided injection of the right knee Device: Samsung HS60  Verbal informed consent obtained.  Time-out conducted.  Noted no overlying erythema, induration, or other signs of local infection.  Skin prepped in a sterile fashion.  Local anesthesia: Topical Ethyl chloride.  With sterile technique and under real time ultrasound guidance:  Noted trace effusion, 1 cc Kenalog 40, 2 cc lidocaine, 2 cc bupivacaine injected easily Completed without difficulty  Advised to call if fevers/chills, erythema, induration, drainage, or persistent bleeding.  Images permanently stored and available for review in PACS.  Impression: Technically successful ultrasound guided injection.  Independent interpretation of notes and tests performed by another provider:   None.  Brief History, Exam, Impression, and Recommendations:    Primary osteoarthritis of both knees This is a pleasant 45 year old female with known bilateral knee osteoarthritis, she had an MRI in 2019 that showed tricompartmental osteoarthritis without meniscal tearing, her last injection was in January 2021, she did well until recently. The cold weather has increased her pain, she is  hurting more anteriorly, she has tried Tylenol, NSAIDs, topical NSAIDs, topical pain relievers without much improvement, so today we injected both of her knees, return to see me as needed.    ___________________________________________ Ihor Austin. Benjamin Stain, M.D., ABFM., CAQSM. Primary Care and Sports Medicine [facility]  Adjunct Instructor of Family Medicine  [employer]

## 2020-03-17 NOTE — Assessment & Plan Note (Signed)
This is a pleasant 45 year old female with known bilateral knee osteoarthritis, she had an MRI in 2019 that showed tricompartmental osteoarthritis without meniscal tearing, her last injection was in January 2021, she did well until recently. The cold weather has increased her pain, she is hurting more anteriorly, she has tried Tylenol, NSAIDs, topical NSAIDs, topical pain relievers without much improvement, so today we injected both of her knees, return to see me as needed.

## 2020-04-13 ENCOUNTER — Other Ambulatory Visit: Payer: Self-pay | Admitting: Physician Assistant

## 2020-04-13 DIAGNOSIS — E6609 Other obesity due to excess calories: Secondary | ICD-10-CM

## 2020-04-13 DIAGNOSIS — Z6834 Body mass index (BMI) 34.0-34.9, adult: Secondary | ICD-10-CM

## 2020-04-19 ENCOUNTER — Telehealth: Payer: Self-pay | Admitting: Neurology

## 2020-04-19 ENCOUNTER — Encounter: Payer: Self-pay | Admitting: Physician Assistant

## 2020-04-19 ENCOUNTER — Other Ambulatory Visit: Payer: Self-pay | Admitting: Physician Assistant

## 2020-04-19 DIAGNOSIS — Z6834 Body mass index (BMI) 34.0-34.9, adult: Secondary | ICD-10-CM

## 2020-04-19 DIAGNOSIS — E6609 Other obesity due to excess calories: Secondary | ICD-10-CM

## 2020-04-19 NOTE — Telephone Encounter (Signed)
Patient left vm asking why phentermine was denied.  Patient made aware #90 sent on 02/03/2020, she should have enough until her appt. Offered for her to move appt earlier to discuss, but can't send in early refill. Patient was not happy with this, but expressed understanding and did not want to move appt.

## 2020-04-20 ENCOUNTER — Other Ambulatory Visit: Payer: Self-pay | Admitting: Physician Assistant

## 2020-04-20 NOTE — Progress Notes (Signed)
..  PDMP reviewed during this encounter.  12/9 refilled phentermine. Should be another rx at pharmacy since we have proof of 12/29 rx sent.

## 2020-04-21 NOTE — Telephone Encounter (Signed)
Looks like RX sent on 02/03/2020 was sent to [facility].  Not sure what RX she picked up on 12/9. I don't see anything written before that except in September?

## 2020-04-22 ENCOUNTER — Other Ambulatory Visit: Payer: Self-pay | Admitting: Physician Assistant

## 2020-04-22 DIAGNOSIS — E1165 Type 2 diabetes mellitus with hyperglycemia: Secondary | ICD-10-CM

## 2020-04-22 MED ORDER — PHENTERMINE HCL 15 MG PO CAPS: 15.0000 mg | ORAL_CAPSULE | Freq: Every morning | ORAL | 0 refills | Status: DC

## 2020-05-03 ENCOUNTER — Other Ambulatory Visit: Payer: Self-pay | Admitting: Physician Assistant

## 2020-05-03 DIAGNOSIS — E1165 Type 2 diabetes mellitus with hyperglycemia: Secondary | ICD-10-CM

## 2020-05-04 ENCOUNTER — Encounter: Payer: Self-pay | Admitting: Physician Assistant

## 2020-05-04 ENCOUNTER — Ambulatory Visit (INDEPENDENT_AMBULATORY_CARE_PROVIDER_SITE_OTHER): Payer: 59 | Admitting: Physician Assistant

## 2020-05-04 ENCOUNTER — Other Ambulatory Visit: Payer: Self-pay

## 2020-05-04 VITALS — BP 149/85 | HR 90 | Ht 67.0 in | Wt 228.0 lb

## 2020-05-04 DIAGNOSIS — E039 Hypothyroidism, unspecified: Secondary | ICD-10-CM

## 2020-05-04 DIAGNOSIS — Z79899 Other long term (current) drug therapy: Secondary | ICD-10-CM

## 2020-05-04 DIAGNOSIS — L03113 Cellulitis of right upper limb: Secondary | ICD-10-CM

## 2020-05-04 DIAGNOSIS — E6609 Other obesity due to excess calories: Secondary | ICD-10-CM | POA: Diagnosis not present

## 2020-05-04 DIAGNOSIS — E785 Hyperlipidemia, unspecified: Secondary | ICD-10-CM

## 2020-05-04 DIAGNOSIS — E119 Type 2 diabetes mellitus without complications: Secondary | ICD-10-CM | POA: Diagnosis not present

## 2020-05-04 DIAGNOSIS — Z6834 Body mass index (BMI) 34.0-34.9, adult: Secondary | ICD-10-CM

## 2020-05-04 DIAGNOSIS — E1165 Type 2 diabetes mellitus with hyperglycemia: Secondary | ICD-10-CM

## 2020-05-04 DIAGNOSIS — Z6833 Body mass index (BMI) 33.0-33.9, adult: Secondary | ICD-10-CM

## 2020-05-04 DIAGNOSIS — Z3009 Encounter for other general counseling and advice on contraception: Secondary | ICD-10-CM

## 2020-05-04 DIAGNOSIS — W540XXA Bitten by dog, initial encounter: Secondary | ICD-10-CM

## 2020-05-04 DIAGNOSIS — N6310 Unspecified lump in the right breast, unspecified quadrant: Secondary | ICD-10-CM

## 2020-05-04 LAB — POCT UA - MICROALBUMIN
Albumin/Creatinine Ratio, Urine, POC: <30
Creatinine, POC: 200 mg/dL
Microalbumin Ur, POC: 10 mg/L

## 2020-05-04 LAB — POCT GLYCOSYLATED HEMOGLOBIN (HGB A1C): Hemoglobin A1C: 5.1 % (ref 4.0–5.6)

## 2020-05-04 MED ORDER — TRULICITY 1.5 MG/0.5ML ~~LOC~~ SOAJ: 1.5000 mg | SUBCUTANEOUS | 0 refills | Status: DC

## 2020-05-04 MED ORDER — NORETHIN ACE-ETH ESTRAD-FE 1.5-30 MG-MCG PO TABS: 1.0000 | ORAL_TABLET | Freq: Every day | ORAL | 3 refills | Status: DC

## 2020-05-04 MED ORDER — SYNJARDY XR 10-1000 MG PO TB24: 1.0000 | ORAL_TABLET | Freq: Every day | ORAL | 0 refills | Status: DC

## 2020-05-04 MED ORDER — AMOXICILLIN-POT CLAVULANATE 875-125 MG PO TABS: 1.0000 | ORAL_TABLET | Freq: Two times a day (BID) | ORAL | 0 refills | Status: AC

## 2020-05-04 NOTE — Progress Notes (Addendum)
Subjective:    Patient ID: Tina Boone, female    DOB: 1975-02-10, 45 y.o.   MRN: [id number]  HPI  Patient is a 45 year old obese female with type 2 diabetes, hypothyroidism, HLD who presents to the clinic for follow-up and medication refills.  Patient continues to work on weight and diabetes control.  She is not really checking her sugars.  She is compliant with her medications however she has missed the last few doses due to being out of it.  She denies any hypoglycemic events.  She denies any open sores or wounds.  She is trying to stay active with walking and keep a relatively low sugar low-carb diet.  She is also taking Topamax and phentermine for weight loss.  She feels like it has not been helping as much.  She mentions getting bit by a dog in her upper right arm about 4 days ago.  It is painful and swollen.  She has been topically keeping it clean.  She has not had any antibiotics.  She is up-to-date on her tetanus.  She denies any fever, chills, night sweats.  She does take her blood pressure at home some.  Is never this elevated.  She has been really stressed with work.  Patient is overdue for her follow-up mammogram on her right breast mass.  She does not want to go back to the location she previously went to.  She would like another referral.  .. Active Ambulatory Problems    Diagnosis Date Noted  . Contraception management 03/06/2013  . Essential hypertension, benign 04/01/2013  . Hypothyroidism 04/03/2013  . Hypertriglyceridemia without hypercholesterolemia 09/23/2015  . Adjustment disorder with mixed anxiety and depressed mood 06/22/2016  . Controlled type 2 diabetes mellitus without complication, without long-term current use of insulin (HCC) 06/25/2017  . Class 1 obesity due to excess calories with serious comorbidity and body mass index (BMI) of 33.0 to 33.9 in adult 06/25/2017  . Primary osteoarthritis of both knees 12/10/2017  . Breast mass, right  04/02/2018  . Bilateral plantar fasciitis 04/07/2018   Resolved Ambulatory Problems    Diagnosis Date Noted  . Morbid obesity with BMI of 45.0-49.9, adult (HCC) 01/23/2012  . Well adult exam 01/23/2012  . Elevated blood pressure 03/06/2013  . Elevated fasting glucose 06/25/2017   Past Medical History:  Diagnosis Date  . Diabetes (HCC)   . Hyperlipidemia   . Hypothyroid     Review of Systems  All other systems reviewed and are negative.      Objective:   Physical Exam Vitals reviewed.  Constitutional:      Appearance: Normal appearance. She is obese.  Cardiovascular:     Rate and Rhythm: Normal rate and regular rhythm.     Pulses: Normal pulses.  Pulmonary:     Effort: Pulmonary effort is normal.     Breath sounds: Normal breath sounds.  Skin:    Comments: Right upper arm dog bite lacerations, erythematous upper arm that is warm and swollen.   Neurological:     General: No focal deficit present.     Mental Status: She is alert and oriented to person, place, and time.  Psychiatric:        Mood and Affect: Mood normal.       .. Results for orders placed or performed in visit on 05/04/20  POCT glycosylated hemoglobin (Hb A1C)  Result Value Ref Range   Hemoglobin A1C 5.1 4.0 - 5.6 %   HbA1c POC (<> result,  manual entry)     HbA1c, POC (prediabetic range)     HbA1c, POC (controlled diabetic range)    POCT UA - Microalbumin  Result Value Ref Range   Microalbumin Ur, POC 10 mg/L   Creatinine, POC 200 mg/dL   Albumin/Creatinine Ratio, Urine, POC <30         Assessment & Plan:  Marland KitchenMarland KitchenTashai was seen today for diabetes.  Diagnoses and all orders for this visit:  Controlled type 2 diabetes mellitus without complication, without long-term current use of insulin (HCC) -     POCT glycosylated hemoglobin (Hb A1C) -     COMPLETE METABOLIC PANEL WITH GFR -     POCT UA - Microalbumin -     Empagliflozin-metFORMIN HCl ER (SYNJARDY XR) 11-998 MG TB24; Take 1 tablet  by mouth daily. -     Dulaglutide (TRULICITY) 1.5 MG/0.5ML SOPN; Inject 1.5 mg into the skin once a week. -     amoxicillin-clavulanate (AUGMENTIN) 875-125 MG tablet; Take 1 tablet by mouth 2 (two) times daily for 10 days.  Hyperlipidemia LDL goal <70 -     Lipid Panel w/reflex Direct LDL  Hypothyroidism, unspecified type -     TSH  Class 1 obesity due to excess calories with serious comorbidity and body mass index (BMI) of 33.0 to 33.9 in adult -     CBC with Differential/Platelet  Medication management -     CBC with Differential/Platelet  Birth control counseling -     norethindrone-ethinyl estradiol-iron (MICROGESTIN FE 1.5/30) 1.5-30 MG-MCG tablet; Take 1 tablet by mouth daily.  Class 1 obesity due to excess calories with serious comorbidity and body mass index (BMI) of 34.0 to 34.9 in adult  Dog bite, initial encounter  Cellulitis of right upper extremity  Breast mass, right   A1C is great.  Continue same medications.  Increased trulicity just a bit for more weight loss.  BP not to goal. Pt reports great readings at home.  Micro normal. Needs eye exam.  Declines covid vaccine/flu shot/pneumonia shot.  Follow up in 3 months.   Dog bite right upper arm with cellulitis.  Treated with augmentin.  tdap UTD.  Warm compresses.   Needs Diagnostic mammogram and right breast ultrasound. She does not want to go back to breast clinic and would like order in [place].   IMPRESSION: 1. There is a probably benign mass in the right breast at 11 o'clock, favored to represent a fibroadenoma.  2. There is an asymmetry in the superior right breast without a sonographic correlate. This is also a probably benign finding.  RECOMMENDATION: Six-month follow-up diagnostic right breast mammogram and ultrasound is recommended to monitor the asymmetry and the probably benign right breast mass.

## 2020-05-04 NOTE — Patient Instructions (Signed)
Animal Bite, Adult Animal bites range from mild to serious. An animal bite can result in any of these injuries:  A scratch.  A deep, open cut.  A puncture of the skin.  A crush injury.  Tearing away of the skin or a body part.  A bone injury. A small bite from a house pet is usually less serious than a bite from a stray or wild animal, such as a raccoon, fox, skunk, or bat. That is because stray and wild animals have a higher risk of carrying a serious infection called rabies, which can be passed to humans through a bite. What increases the risk? You are more likely to be bitten by an animal if:  You are around unfamiliar pets.  You disturb an animal when it is eating, sleeping, or caring for its babies.  You are outdoors in a place where small, wild animals roam freely. What are the signs or symptoms? Common symptoms of an animal bite include:  Pain.  Bleeding.  Swelling.  Bruising. How is this diagnosed? This condition may be diagnosed based on a physical exam and medical history. Your health care provider will examine your wound and ask for details about the animal and how the bite happened. You may also have tests, such as:  Blood tests to check for infection.  X-rays to check for damage to bones or joints.  Taking a fluid sample from your wound and checking it for infection (culture test). How is this treated? Treatment varies depending on the type of animal, where the bite is on your body, and your medical history. Treatment may include:  Caring for the wound. This often includes cleaning the wound, rinsing out (flushing) the wound with saline solution, and applying a bandage (dressing). In some cases, the wound may be closed with stitches (sutures), staples, skin glue, or adhesive strips.  Antibiotic medicine to prevent or treat infection. This medicine may be prescribed in pill or ointment form. If the bite area becomes infected, the medicine may be given through  an IV.  A tetanus shot to prevent tetanus infection.  Rabies treatment to prevent rabies infection. This will be done if the animal could have rabies.  Surgery. This may be done if a bite gets infected or if there is damage that needs to be repaired. Follow these instructions at home: Wound care  Follow instructions from your health care provider about how to take care of your wound. Make sure you: ? Wash your hands with soap and water before you change your dressing. If soap and water are not available, use hand sanitizer. ? Change your dressing as told by your health care provider. ? Leave sutures, skin glue, or adhesive strips in place. These skin closures may need to stay in place for 2 weeks or longer. If adhesive strip edges start to loosen and curl up, you may trim the loose edges. Do not remove adhesive strips completely unless your health care provider tells you to do that.  Check your wound every day for signs of infection. Check for: ? More redness, swelling, or pain. ? More fluid or blood. ? Warmth. ? Pus or a bad smell.   Medicines  Take or apply over-the-counter and prescription medicines only as told by your health care provider.  If you were prescribed an antibiotic, take or apply it as told by your health care provider. Do not stop using the antibiotic even if your condition improves. General instructions  Keep the injured   area raised (elevated) above the level of your heart while you are sitting or lying down, if this is possible.  If directed, put ice on the injured area. ? Put ice in a plastic bag. ? Place a towel between your skin and the bag. ? Leave the ice on for 20 minutes, 2-3 times per day.  Keep all follow-up visits as told by your health care provider. This is important.   Contact a health care provider if:  You have more redness, swelling, or pain around your wound.  Your wound feels warm to the touch.  You have a fever or chills.  You have a  general feeling of sickness (malaise).  You feel nauseous or you vomit.  You have pain that does not get better. Get help right away if:  You have a red streak that leads away from your wound.  You have non-clear fluid or more blood coming from your wound.  There is pus or a bad smell coming from your wound.  You have trouble moving your injured area.  You have numbness or tingling that extends beyond the wound. Summary  Animal bites can range from mild to serious. An animal bite can cause a scratch on the skin, a deep open cut, a puncture of the skin, a crush injury, tearing away of the skin or a body part, or a bone injury.  Your health care provider will examine your wound and ask for details about the animal and how the bite happened.  You may also have tests such as a blood test, X-ray, or testing of a fluid sample from your wound (culture test).  Treatment may include wound care, antibiotic medicine, a tetanus shot, and rabies treatment if the animal could have rabies. This information is not intended to replace advice given to you by your health care provider. Make sure you discuss any questions you have with your health care provider. Document Revised: 11/17/2019 Document Reviewed: 11/17/2019 Elsevier Patient Education  2021 [employer]  

## 2020-05-26 ENCOUNTER — Other Ambulatory Visit: Payer: Self-pay | Admitting: Physician Assistant

## 2020-05-26 DIAGNOSIS — E038 Other specified hypothyroidism: Secondary | ICD-10-CM

## 2020-05-26 DIAGNOSIS — E785 Hyperlipidemia, unspecified: Secondary | ICD-10-CM

## 2020-06-09 ENCOUNTER — Other Ambulatory Visit: Payer: Self-pay | Admitting: Sports Medicine

## 2020-06-09 DIAGNOSIS — M170 Bilateral primary osteoarthritis of knee: Secondary | ICD-10-CM

## 2020-06-11 ENCOUNTER — Other Ambulatory Visit: Payer: Self-pay | Admitting: Physician Assistant

## 2020-06-11 DIAGNOSIS — E6609 Other obesity due to excess calories: Secondary | ICD-10-CM

## 2020-06-17 ENCOUNTER — Other Ambulatory Visit: Payer: Self-pay | Admitting: Physician Assistant

## 2020-06-17 DIAGNOSIS — N6489 Other specified disorders of breast: Secondary | ICD-10-CM

## 2020-07-22 ENCOUNTER — Other Ambulatory Visit: Payer: Self-pay | Admitting: Physician Assistant

## 2020-07-22 DIAGNOSIS — Z6834 Body mass index (BMI) 34.0-34.9, adult: Secondary | ICD-10-CM

## 2020-07-22 DIAGNOSIS — E6609 Other obesity due to excess calories: Secondary | ICD-10-CM

## 2020-07-25 ENCOUNTER — Telehealth: Payer: Self-pay | Admitting: Physician Assistant

## 2020-07-25 ENCOUNTER — Encounter: Payer: Self-pay | Admitting: Physician Assistant

## 2020-07-25 DIAGNOSIS — Z6834 Body mass index (BMI) 34.0-34.9, adult: Secondary | ICD-10-CM

## 2020-07-25 DIAGNOSIS — E6609 Other obesity due to excess calories: Secondary | ICD-10-CM

## 2020-07-25 MED ORDER — PHENTERMINE HCL 15 MG PO CAPS: 15.0000 mg | ORAL_CAPSULE | Freq: Every morning | ORAL | 0 refills | Status: DC

## 2020-07-25 NOTE — Telephone Encounter (Signed)
-----   Message from Mychart, Generic sent at 07/25/2020 12:21 PM EDT ----- Regarding: Your message may not be read Contact: [phone]    ----- Delivery failure of internet email alert  Tickler type: Message Message Id(WMG): [id number] SMTP Response: [id number] Patient: Tina Boone,Tina Boone ZDGLOV([id number]) Internet alert email: [email or web address]     ----- Original WMG message to the patient ----- Sent: 07/25/2020 12:21 PM From: Jomarie Longs, PA-C To: Rankin,Shaneeka BENTON Message Type: Patient Medical Advice Request Subject: Phentermine again. Yes, on the 18th the 3 month supply ran out. We need to adjust 3 month appts to the 17th of the month. I sent refill.    ----- Message -----      From:[name]      Sent:07/25/2020 10:59 AM EDT        JO:ACZY Atthew Coutant, PA-C   Subject:Phentermine again.  Called in another prescription on Friday and was told (too late to contact you that day) they could not fill it because your office told them I need another appointment. My appointment is scheduled, as it has been since my last visit, for July 6th. I think the dates were not fixed in the system since our last conversation regarding this medicine. could you please check? I am out as of yesterday.

## 2020-07-25 NOTE — Telephone Encounter (Signed)
Yes, on the 18th the 3 month supply ran out. We need to adjust 3 month appts to the 17th of the month. I sent refill.

## 2020-07-25 NOTE — Telephone Encounter (Signed)
Next appt will be made to accommodate refills.

## 2020-08-05 ENCOUNTER — Other Ambulatory Visit: Payer: Self-pay | Admitting: Physician Assistant

## 2020-08-05 DIAGNOSIS — E119 Type 2 diabetes mellitus without complications: Secondary | ICD-10-CM

## 2020-08-10 ENCOUNTER — Other Ambulatory Visit: Payer: Self-pay

## 2020-08-10 ENCOUNTER — Ambulatory Visit (INDEPENDENT_AMBULATORY_CARE_PROVIDER_SITE_OTHER): Payer: 59 | Admitting: Physician Assistant

## 2020-08-10 VITALS — BP 157/83 | HR 86 | Ht 67.0 in | Wt 213.0 lb

## 2020-08-10 DIAGNOSIS — Z6833 Body mass index (BMI) 33.0-33.9, adult: Secondary | ICD-10-CM

## 2020-08-10 DIAGNOSIS — E119 Type 2 diabetes mellitus without complications: Secondary | ICD-10-CM | POA: Diagnosis not present

## 2020-08-10 DIAGNOSIS — E039 Hypothyroidism, unspecified: Secondary | ICD-10-CM | POA: Diagnosis not present

## 2020-08-10 DIAGNOSIS — I10 Essential (primary) hypertension: Secondary | ICD-10-CM | POA: Diagnosis not present

## 2020-08-10 DIAGNOSIS — E6609 Other obesity due to excess calories: Secondary | ICD-10-CM

## 2020-08-10 DIAGNOSIS — J310 Chronic rhinitis: Secondary | ICD-10-CM

## 2020-08-10 DIAGNOSIS — M62830 Muscle spasm of back: Secondary | ICD-10-CM | POA: Diagnosis not present

## 2020-08-10 DIAGNOSIS — J329 Chronic sinusitis, unspecified: Secondary | ICD-10-CM

## 2020-08-10 DIAGNOSIS — E785 Hyperlipidemia, unspecified: Secondary | ICD-10-CM

## 2020-08-10 DIAGNOSIS — E66811 Obesity, class 1: Secondary | ICD-10-CM

## 2020-08-10 LAB — POCT GLYCOSYLATED HEMOGLOBIN (HGB A1C): HbA1c, POC (controlled diabetic range): 5 % (ref 0.0–7.0)

## 2020-08-10 MED ORDER — LOSARTAN POTASSIUM 25 MG PO TABS: 25.0000 mg | ORAL_TABLET | Freq: Every day | ORAL | 0 refills | Status: DC

## 2020-08-10 MED ORDER — KETOROLAC TROMETHAMINE 60 MG/2ML IM SOLN
60.0000 mg | Freq: Once | INTRAMUSCULAR | Status: AC
  Administered 2020-08-10: 60 mg via INTRAMUSCULAR

## 2020-08-10 MED ORDER — TRULICITY 1.5 MG/0.5ML ~~LOC~~ SOAJ: 1.5000 mg | SUBCUTANEOUS | 0 refills | Status: DC

## 2020-08-10 MED ORDER — SYNJARDY XR 10-1000 MG PO TB24: 1.0000 | ORAL_TABLET | Freq: Every day | ORAL | 0 refills | Status: DC

## 2020-08-10 MED ORDER — LEVOCETIRIZINE DIHYDROCHLORIDE 5 MG PO TABS: 5.0000 mg | ORAL_TABLET | Freq: Every evening | ORAL | 5 refills | Status: DC

## 2020-08-10 NOTE — Patient Instructions (Addendum)
Lloyd Huger Med sinus rinse OTC.  Start xzyal with flonase.  Start cozaar for blood pressure.

## 2020-08-10 NOTE — Progress Notes (Signed)
Subjective:    Patient ID: Tina Boone, female    DOB: 16-Apr-1975, 45 y.o.   MRN: [id number]  HPI Patient is a 45 year old obese female with type 2 diabetes, hypothyroidism, hyperlipidemia who presents to the clinic for 47-month follow-up.  Patient is not checking her sugars at home.  She is on Trulicity and metformin.  She denies any hypoglycemic events.  She denies any open sores or wounds.  She denies any chest pain, palpitations, headaches or vision changes.  She continues to work on her weight loss.  She is down from 2 28-213 on Topamax and phentermine.  She denies any side effects to the medication.  She would like to continue.  Patient is very active at her job.  Yesterday was a particularly hard day after holiday weekend.  She is very sore.  Her shoulders, neck, back, legs, hips, knees are all very achy.  She has to work today.  She does not take any medication for this at this time.  She also complains of some chronic nasal congestion and pressure.  She has to work really hard to get it unclogged.  She wants to know if she can do about this.  She does work outside.  She does notice it is worse when she is outside for long periods of time. No fever, chills.  .. Active Ambulatory Problems    Diagnosis Date Noted   Contraception management 03/06/2013   Essential hypertension, benign 04/01/2013   Hypothyroidism 04/03/2013   Hypertriglyceridemia without hypercholesterolemia 09/23/2015   Adjustment disorder with mixed anxiety and depressed mood 06/22/2016   Controlled type 2 diabetes mellitus without complication, without long-term current use of insulin (HCC) 06/25/2017   Class 1 obesity due to excess calories with serious comorbidity and body mass index (BMI) of 33.0 to 33.9 in adult 06/25/2017   Primary osteoarthritis of both knees 12/10/2017   Breast mass, right 04/02/2018   Bilateral plantar fasciitis 04/07/2018   Muscle spasm of back 08/10/2020   Hyperlipidemia LDL  goal <70 08/12/2020   Resolved Ambulatory Problems    Diagnosis Date Noted   Morbid obesity with BMI of 45.0-49.9, adult (HCC) 01/23/2012   Well adult exam 01/23/2012   Elevated blood pressure 03/06/2013   Elevated fasting glucose 06/25/2017   Past Medical History:  Diagnosis Date   Diabetes (HCC)    Hyperlipidemia    Hypothyroid     Review of Systems    See HPI.  Objective:   Physical Exam Vitals reviewed.  Constitutional:      Appearance: Normal appearance. She is obese.  HENT:     Head: Normocephalic.  Cardiovascular:     Rate and Rhythm: Normal rate and regular rhythm.     Pulses: Normal pulses.  Pulmonary:     Effort: Pulmonary effort is normal.     Breath sounds: Normal breath sounds.  Musculoskeletal:     Right lower leg: No edema.     Left lower leg: No edema.     Comments: Muscle stiffness/tightness limited ROM at waist, knees, shoulders due to pain.   Neurological:     General: No focal deficit present.     Mental Status: She is alert and oriented to person, place, and time.  Psychiatric:        Mood and Affect: Mood normal.      .. Results for orders placed or performed in visit on 08/10/20  TSH  Result Value Ref Range   TSH 2.45 mIU/L  Lipid Panel  w/reflex Direct LDL  Result Value Ref Range   Cholesterol 129 <200 mg/dL   HDL 47 (L) > OR = 50 mg/dL   Triglycerides 497 (H) <150 mg/dL   LDL Cholesterol (Calc) 59 mg/dL (calc)   Total CHOL/HDL Ratio 2.7 <5.0 (calc)   Non-HDL Cholesterol (Calc) 82 <026 mg/dL (calc)  CBC with Differential/Platelet  Result Value Ref Range   WBC 9.8 3.8 - 10.8 Thousand/uL   RBC 5.34 (H) 3.80 - 5.10 Million/uL   Hemoglobin 15.8 (H) 11.7 - 15.5 g/dL   HCT 37.8 (H) 58.8 - 50.2 %   MCV 89.5 80.0 - 100.0 fL   MCH 29.6 27.0 - 33.0 pg   MCHC 33.1 32.0 - 36.0 g/dL   RDW 77.4 12.8 - 78.6 %   Platelets 342 140 - 400 Thousand/uL   MPV 9.6 7.5 - 12.5 fL   Neutro Abs 6,292 1,500 - 7,800 cells/uL   Lymphs Abs 2,705 850 -  3,900 cells/uL   Absolute Monocytes 470 200 - 950 cells/uL   Eosinophils Absolute 265 15 - 500 cells/uL   Basophils Absolute 69 0 - 200 cells/uL   Neutrophils Relative % 64.2 %   Total Lymphocyte 27.6 %   Monocytes Relative 4.8 %   Eosinophils Relative 2.7 %   Basophils Relative 0.7 %  COMPLETE METABOLIC PANEL WITH GFR  Result Value Ref Range   Glucose, Bld 77 65 - 99 mg/dL   BUN 17 7 - 25 mg/dL   Creat 7.67 2.09 - 4.70 mg/dL   GFR, Est Non African American 88 > OR = 60 mL/min/1.76m2   GFR, Est African American 102 > OR = 60 mL/min/1.23m2   BUN/Creatinine Ratio NOT APPLICABLE 6 - 22 (calc)   Sodium 138 135 - 146 mmol/L   Potassium 4.4 3.5 - 5.3 mmol/L   Chloride 107 98 - 110 mmol/L   CO2 24 20 - 32 mmol/L   Calcium 9.5 8.6 - 10.2 mg/dL   Total Protein 6.9 6.1 - 8.1 g/dL   Albumin 4.0 3.6 - 5.1 g/dL   Globulin 2.9 1.9 - 3.7 g/dL (calc)   AG Ratio 1.4 1.0 - 2.5 (calc)   Total Bilirubin 0.6 0.2 - 1.2 mg/dL   Alkaline phosphatase (APISO) 63 31 - 125 U/L   AST 16 10 - 35 U/L   ALT 16 6 - 29 U/L  POCT glycosylated hemoglobin (Hb A1C)  Result Value Ref Range   Hemoglobin A1C     HbA1c POC (<> result, manual entry)     HbA1c, POC (prediabetic range)     HbA1c, POC (controlled diabetic range) 5.0 0.0 - 7.0 %       Assessment & Plan:  Marland KitchenMarland KitchenRosamae was seen today for diabetes.  Diagnoses and all orders for this visit:  Controlled type 2 diabetes mellitus without complication, without long-term current use of insulin (HCC) -     POCT glycosylated hemoglobin (Hb A1C) -     Ambulatory referral to Ophthalmology -     Empagliflozin-metFORMIN HCl ER (SYNJARDY XR) 11-998 MG TB24; Take 1 tablet by mouth daily. -     COMPLETE METABOLIC PANEL WITH GFR -     Dulaglutide (TRULICITY) 1.5 MG/0.5ML SOPN; Inject 1.5 mg into the skin once a week.  Essential hypertension, benign -     COMPLETE METABOLIC PANEL WITH GFR  Muscle spasm of back -     ketorolac (TORADOL) injection 60  mg  Hypothyroidism, unspecified type -     TSH -  CBC with Differential/Platelet  Hyperlipidemia LDL goal <70 -     Lipid Panel w/reflex Direct LDL  Class 1 obesity due to excess calories with serious comorbidity and body mass index (BMI) of 33.0 to 33.9 in adult  Other orders -     losartan (COZAAR) 25 MG tablet; Take 1 tablet (25 mg total) by mouth daily. -     levocetirizine (XYZAL) 5 MG tablet; Take 1 tablet (5 mg total) by mouth every evening.  A1c to goal.  Continue on same medications.  BP not to goal today. Added cozzar looking at previous elevations as well.  On statin.  Foot exam UTD.  Needs eye exam.  Declines covid vaccine.  Pneumonia UTD.  Follow up in 3 months.   Toradol given for muscle spasms and myalgia. Muscle relaxers make too sleepy. Ok to restart NSAIDs tomorrow. Tylenol for today.   Pt has lost weight.  Continue phentermine/topamax combination.   Chronic rhinosinusitis. Start xyzal and flonase. Consider neil med sinuses rinses.

## 2020-08-11 LAB — CBC WITH DIFFERENTIAL/PLATELET
Absolute Monocytes: 470 cells/uL (ref 200–950)
Basophils Absolute: 69 cells/uL (ref 0–200)
Basophils Relative: 0.7 %
Eosinophils Absolute: 265 cells/uL (ref 15–500)
Eosinophils Relative: 2.7 %
HCT: 47.8 % — ABNORMAL HIGH (ref 35.0–45.0)
Hemoglobin: 15.8 g/dL — ABNORMAL HIGH (ref 11.7–15.5)
Lymphs Abs: 2705 cells/uL (ref 850–3900)
MCH: 29.6 pg (ref 27.0–33.0)
MCHC: 33.1 g/dL (ref 32.0–36.0)
MCV: 89.5 fL (ref 80.0–100.0)
MPV: 9.6 fL (ref 7.5–12.5)
Monocytes Relative: 4.8 %
Neutro Abs: 6292 cells/uL (ref 1500–7800)
Neutrophils Relative %: 64.2 %
Platelets: 342 10*3/uL (ref 140–400)
RBC: 5.34 10*6/uL — ABNORMAL HIGH (ref 3.80–5.10)
RDW: 13 % (ref 11.0–15.0)
Total Lymphocyte: 27.6 %
WBC: 9.8 10*3/uL (ref 3.8–10.8)

## 2020-08-11 LAB — COMPLETE METABOLIC PANEL WITH GFR
AG Ratio: 1.4 (calc) (ref 1.0–2.5)
ALT: 16 U/L (ref 6–29)
AST: 16 U/L (ref 10–35)
Albumin: 4 g/dL (ref 3.6–5.1)
Alkaline phosphatase (APISO): 63 U/L (ref 31–125)
BUN: 17 mg/dL (ref 7–25)
CO2: 24 mmol/L (ref 20–32)
Calcium: 9.5 mg/dL (ref 8.6–10.2)
Chloride: 107 mmol/L (ref 98–110)
Creat: 0.81 mg/dL (ref 0.50–1.10)
GFR, Est African American: 102 mL/min/{1.73_m2} (ref >=60)
GFR, Est Non African American: 88 mL/min/{1.73_m2} (ref >=60)
Globulin: 2.9 g/dL (calc) (ref 1.9–3.7)
Glucose, Bld: 77 mg/dL (ref 65–99)
Potassium: 4.4 mmol/L (ref 3.5–5.3)
Sodium: 138 mmol/L (ref 135–146)
Total Bilirubin: 0.6 mg/dL (ref 0.2–1.2)
Total Protein: 6.9 g/dL (ref 6.1–8.1)

## 2020-08-11 LAB — TSH: TSH: 2.45 mIU/L

## 2020-08-11 LAB — LIPID PANEL W/REFLEX DIRECT LDL
Cholesterol: 129 mg/dL (ref <200)
HDL: 47 mg/dL — ABNORMAL LOW (ref >=50)
LDL Cholesterol (Calc): 59 mg/dL (calc)
Non-HDL Cholesterol (Calc): 82 mg/dL (calc) (ref <130)
Total CHOL/HDL Ratio: 2.7 (calc) (ref <5.0)
Triglycerides: 150 mg/dL — ABNORMAL HIGH (ref <150)

## 2020-08-11 NOTE — Progress Notes (Signed)
Tina Boone,   Thyroid looks great.  LDL, bad cholesterol, looks great.  HDL, good cholesterol, could be higher. Increase good fats and exercise.

## 2020-08-12 ENCOUNTER — Encounter: Payer: Self-pay | Admitting: Physician Assistant

## 2020-08-12 DIAGNOSIS — J310 Chronic rhinitis: Secondary | ICD-10-CM | POA: Insufficient documentation

## 2020-08-12 DIAGNOSIS — J329 Chronic sinusitis, unspecified: Secondary | ICD-10-CM | POA: Insufficient documentation

## 2020-08-12 DIAGNOSIS — E785 Hyperlipidemia, unspecified: Secondary | ICD-10-CM | POA: Insufficient documentation

## 2020-08-28 ENCOUNTER — Other Ambulatory Visit: Payer: Self-pay | Admitting: Physician Assistant

## 2020-08-28 DIAGNOSIS — M722 Plantar fascial fibromatosis: Secondary | ICD-10-CM

## 2020-09-06 ENCOUNTER — Other Ambulatory Visit: Payer: Self-pay | Admitting: Physician Assistant

## 2020-09-06 DIAGNOSIS — E785 Hyperlipidemia, unspecified: Secondary | ICD-10-CM

## 2020-09-06 DIAGNOSIS — E038 Other specified hypothyroidism: Secondary | ICD-10-CM

## 2020-09-21 LAB — HM DIABETES EYE EXAM

## 2020-09-22 ENCOUNTER — Encounter: Payer: Self-pay | Admitting: Neurology

## 2020-10-19 ENCOUNTER — Other Ambulatory Visit: Payer: Self-pay | Admitting: Physician Assistant

## 2020-10-19 ENCOUNTER — Encounter: Payer: Self-pay | Admitting: Physician Assistant

## 2020-10-19 DIAGNOSIS — Z6834 Body mass index (BMI) 34.0-34.9, adult: Secondary | ICD-10-CM

## 2020-10-19 DIAGNOSIS — E6609 Other obesity due to excess calories: Secondary | ICD-10-CM

## 2020-10-19 MED ORDER — PHENTERMINE HCL 15 MG PO CAPS: 15.0000 mg | ORAL_CAPSULE | Freq: Every morning | ORAL | 0 refills | Status: DC

## 2020-10-24 LAB — HM MAMMOGRAPHY

## 2020-11-09 ENCOUNTER — Other Ambulatory Visit: Payer: Self-pay | Admitting: Physician Assistant

## 2020-11-09 ENCOUNTER — Ambulatory Visit: Payer: 59 | Admitting: Physician Assistant

## 2020-11-28 ENCOUNTER — Other Ambulatory Visit: Payer: Self-pay

## 2020-11-28 ENCOUNTER — Ambulatory Visit (INDEPENDENT_AMBULATORY_CARE_PROVIDER_SITE_OTHER): Payer: 59 | Admitting: Physician Assistant

## 2020-11-28 VITALS — BP 128/82 | HR 96 | Ht 67.0 in | Wt 212.0 lb

## 2020-11-28 DIAGNOSIS — Z23 Encounter for immunization: Secondary | ICD-10-CM

## 2020-11-28 DIAGNOSIS — Z6833 Body mass index (BMI) 33.0-33.9, adult: Secondary | ICD-10-CM

## 2020-11-28 DIAGNOSIS — L02419 Cutaneous abscess of limb, unspecified: Secondary | ICD-10-CM | POA: Diagnosis not present

## 2020-11-28 DIAGNOSIS — E66811 Obesity, class 1: Secondary | ICD-10-CM

## 2020-11-28 DIAGNOSIS — E119 Type 2 diabetes mellitus without complications: Secondary | ICD-10-CM

## 2020-11-28 DIAGNOSIS — E6609 Other obesity due to excess calories: Secondary | ICD-10-CM | POA: Diagnosis not present

## 2020-11-28 LAB — POCT GLYCOSYLATED HEMOGLOBIN (HGB A1C): Hemoglobin A1C: 5.1 % (ref 4.0–5.6)

## 2020-11-28 MED ORDER — DOXYCYCLINE HYCLATE 100 MG PO TABS: 100.0000 mg | ORAL_TABLET | Freq: Two times a day (BID) | ORAL | 0 refills | Status: DC

## 2020-11-28 MED ORDER — PHENTERMINE HCL 15 MG PO CAPS: 15.0000 mg | ORAL_CAPSULE | Freq: Every morning | ORAL | 0 refills | Status: DC

## 2020-11-28 MED ORDER — SYNJARDY XR 10-1000 MG PO TB24: 1.0000 | ORAL_TABLET | Freq: Every day | ORAL | 0 refills | Status: DC

## 2020-11-28 MED ORDER — TRULICITY 1.5 MG/0.5ML ~~LOC~~ SOAJ: 1.5000 mg | SUBCUTANEOUS | 0 refills | Status: DC

## 2020-11-28 NOTE — Progress Notes (Signed)
Subjective:    Patient ID: Tina Boone, female    DOB: May 16, 1975, 45 y.o.   MRN: [id number]  HPI Patient is a 45 year old obese female with type 2 diabetes, hypertension, hyperlipidemia who presents to the clinic for medication refill.  Patient is not checking her sugars.  She denies any hypoglycemic events.  She denies any open sores or wounds.  She is active with her job.  She does try to make better food choices.  She is also on phentermine and Topamax for weight loss.  She does feel like this is helping.  She did notice of painful bump under her left arm about a week ago.  It got bigger and bigger until it ruptured.  It started draining fluid but feeling a little better.  The warmth and tenderness have not gone completely away and she would like evaluated.  She denies any fever, chills, nausea, body aches or fatigue. She has not put anything on it.   .. Active Ambulatory Problems    Diagnosis Date Noted   Contraception management 03/06/2013   Essential hypertension, benign 04/01/2013   Hypothyroidism 04/03/2013   Hypertriglyceridemia without hypercholesterolemia 09/23/2015   Adjustment disorder with mixed anxiety and depressed mood 06/22/2016   Controlled type 2 diabetes mellitus without complication, without long-term current use of insulin (HCC) 06/25/2017   Class 1 obesity due to excess calories with serious comorbidity and body mass index (BMI) of 33.0 to 33.9 in adult 06/25/2017   Primary osteoarthritis of both knees 12/10/2017   Breast mass, right 04/02/2018   Bilateral plantar fasciitis 04/07/2018   Muscle spasm of back 08/10/2020   Hyperlipidemia LDL goal <70 08/12/2020   Chronic rhinosinusitis 08/12/2020   Abscess, axilla 11/28/2020   Resolved Ambulatory Problems    Diagnosis Date Noted   Morbid obesity with BMI of 45.0-49.9, adult (HCC) 01/23/2012   Well adult exam 01/23/2012   Elevated blood pressure 03/06/2013   Elevated fasting glucose 06/25/2017    Past Medical History:  Diagnosis Date   Diabetes (HCC)    Hyperlipidemia    Hypothyroid       Review of Systems See HPI.     Objective:   Physical Exam Vitals reviewed.  Constitutional:      Appearance: Normal appearance. She is obese.  HENT:     Head: Normocephalic.  Cardiovascular:     Rate and Rhythm: Normal rate and regular rhythm.     Pulses: Normal pulses.     Heart sounds: Normal heart sounds.  Pulmonary:     Effort: Pulmonary effort is normal.  Abdominal:     General: Bowel sounds are normal. There is no distension.     Palpations: Abdomen is soft. There is no mass.     Tenderness: There is no abdominal tenderness. There is no right CVA tenderness, left CVA tenderness or guarding.  Musculoskeletal:     Right lower leg: No edema.     Left lower leg: No edema.  Skin:    Comments: Left axilla 2cm by 3cm indurated abscess with central scabbed area. Slightly warm and tender to touch. No active draining today.   Neurological:     General: No focal deficit present.     Mental Status: She is alert and oriented to person, place, and time.  Psychiatric:        Mood and Affect: Mood normal.     . Results for orders placed or performed in visit on 11/28/20  POCT glycosylated hemoglobin (Hb A1C)  Result Value Ref Range   Hemoglobin A1C 5.1 4.0 - 5.6 %   HbA1c POC (<> result, manual entry)     HbA1c, POC (prediabetic range)     HbA1c, POC (controlled diabetic range)           Assessment & Plan:  Marland KitchenMarland KitchenAndjela was seen today for diabetes.  Diagnoses and all orders for this visit:  Controlled type 2 diabetes mellitus without complication, without long-term current use of insulin (HCC) -     POCT glycosylated hemoglobin (Hb A1C) -     Dulaglutide (TRULICITY) 1.5 MG/0.5ML SOPN; Inject 1.5 mg into the skin once a week. -     Empagliflozin-metFORMIN HCl ER (SYNJARDY XR) 11-998 MG TB24; Take 1 tablet by mouth daily.  Flu vaccine need -     Flu Vaccine QUAD  44mo+IM (Fluarix, Fluzone & Alfiuria Quad PF)  Class 1 obesity due to excess calories with serious comorbidity and body mass index (BMI) of 33.0 to 33.9 in adult -     phentermine 15 MG capsule; Take 1 capsule (15 mg total) by mouth every morning.  Abscess, axilla -     doxycycline (VIBRA-TABS) 100 MG tablet; Take 1 tablet (100 mg total) by mouth 2 (two) times daily.  A1C to goal. Looks great.  Continue on same medications.  Weight looking better.  Sent refills. Weight loss sent phentermine/topamax combination.  Phentermine printed because not needed right now.  On ACE. BP to goal.  On statin.  Eye exam UTD.  Declines covid and flu vaccine.  Pneumonia vaccine UTD.  Follow up in 3 months.  Goal weight is under 200lbs.   Left axilla abscess. Start doxycycline. Use warm compresses. If not improving or worsening follow up. Pt reports draining which is great news.

## 2020-11-29 ENCOUNTER — Encounter: Payer: Self-pay | Admitting: Physician Assistant

## 2020-12-09 ENCOUNTER — Other Ambulatory Visit: Payer: Self-pay | Admitting: Physician Assistant

## 2020-12-09 DIAGNOSIS — E038 Other specified hypothyroidism: Secondary | ICD-10-CM

## 2020-12-09 DIAGNOSIS — E785 Hyperlipidemia, unspecified: Secondary | ICD-10-CM

## 2020-12-13 ENCOUNTER — Telehealth: Payer: Self-pay | Admitting: Neurology

## 2020-12-13 DIAGNOSIS — E039 Hypothyroidism, unspecified: Secondary | ICD-10-CM

## 2020-12-13 NOTE — Telephone Encounter (Signed)
Received note from pharmacy that they are changing manufacturers for levothyroxine. Patient called and made aware needs labs in 6 weeks. Labs ordered.  

## 2021-02-02 ENCOUNTER — Other Ambulatory Visit: Payer: Self-pay | Admitting: Physician Assistant

## 2021-02-02 DIAGNOSIS — E6609 Other obesity due to excess calories: Secondary | ICD-10-CM

## 2021-02-02 NOTE — Telephone Encounter (Signed)
Last written 11/28/2020 #90 no refills  Too early for refill, please sign denial.

## 2021-02-10 LAB — TSH: TSH: 4.33 mIU/L

## 2021-02-10 NOTE — Progress Notes (Signed)
Ok to send refills if needed.

## 2021-02-10 NOTE — Progress Notes (Signed)
TSH is not in optimal range but in normal range. More towards HYPO(low) thyroid side. Optimal range is 1-2. How are you feeling? We could increase levothyroxine if you would like.

## 2021-02-14 ENCOUNTER — Other Ambulatory Visit: Payer: Self-pay | Admitting: Physician Assistant

## 2021-02-14 DIAGNOSIS — E6609 Other obesity due to excess calories: Secondary | ICD-10-CM

## 2021-02-17 ENCOUNTER — Other Ambulatory Visit: Payer: Self-pay | Admitting: Physician Assistant

## 2021-02-17 DIAGNOSIS — E6609 Other obesity due to excess calories: Secondary | ICD-10-CM

## 2021-02-17 DIAGNOSIS — Z6833 Body mass index (BMI) 33.0-33.9, adult: Secondary | ICD-10-CM

## 2021-02-17 NOTE — Telephone Encounter (Signed)
We have refused this twice, last written 11/28/2020 #90 with no refills She has an appt on 03/01/2021, last seen 11/28/2020 She should have enough medication until 02/26/2021, it looks like she is consistently asking for this early. Please advise.

## 2021-02-17 NOTE — Telephone Encounter (Signed)
..  PDMP reviewed during this encounter. Last filled 10/2021

## 2021-02-20 ENCOUNTER — Other Ambulatory Visit: Payer: Self-pay | Admitting: Physician Assistant

## 2021-03-01 ENCOUNTER — Ambulatory Visit: Payer: 59 | Admitting: Physician Assistant

## 2021-03-01 ENCOUNTER — Other Ambulatory Visit: Payer: Self-pay

## 2021-03-01 ENCOUNTER — Encounter: Payer: Self-pay | Admitting: Physician Assistant

## 2021-03-01 VITALS — BP 139/76 | HR 96 | Ht 67.0 in | Wt 218.0 lb

## 2021-03-01 DIAGNOSIS — E119 Type 2 diabetes mellitus without complications: Secondary | ICD-10-CM

## 2021-03-01 DIAGNOSIS — Z114 Encounter for screening for human immunodeficiency virus [HIV]: Secondary | ICD-10-CM

## 2021-03-01 DIAGNOSIS — E6609 Other obesity due to excess calories: Secondary | ICD-10-CM

## 2021-03-01 DIAGNOSIS — Z1159 Encounter for screening for other viral diseases: Secondary | ICD-10-CM | POA: Diagnosis not present

## 2021-03-01 DIAGNOSIS — E039 Hypothyroidism, unspecified: Secondary | ICD-10-CM

## 2021-03-01 DIAGNOSIS — I10 Essential (primary) hypertension: Secondary | ICD-10-CM

## 2021-03-01 DIAGNOSIS — Z1211 Encounter for screening for malignant neoplasm of colon: Secondary | ICD-10-CM

## 2021-03-01 DIAGNOSIS — Z6834 Body mass index (BMI) 34.0-34.9, adult: Secondary | ICD-10-CM

## 2021-03-01 LAB — POCT GLYCOSYLATED HEMOGLOBIN (HGB A1C): Hemoglobin A1C: 5.1 % (ref 4.0–5.6)

## 2021-03-01 MED ORDER — OZEMPIC (0.25 OR 0.5 MG/DOSE) 2 MG/1.5ML ~~LOC~~ SOPN: 0.5000 mg | PEN_INJECTOR | SUBCUTANEOUS | 2 refills | Status: DC

## 2021-03-01 MED ORDER — METFORMIN HCL ER 750 MG PO TB24: 750.0000 mg | ORAL_TABLET | Freq: Every day | ORAL | 1 refills | Status: DC

## 2021-03-01 MED ORDER — LEVOTHYROXINE SODIUM 125 MCG PO TABS: 125.0000 ug | ORAL_TABLET | Freq: Every day | ORAL | 1 refills | Status: DC

## 2021-03-01 NOTE — Progress Notes (Signed)
Subjective:    Patient ID: Tina Boone, female    DOB: 18-Dec-1975, 46 y.o.   MRN: [id number]  HPI Pt is a 46 yo obese female with T2DM, HTN, HLD, hypothyroidism who presents to the clinic for 3 month follow up.   Pt is doing pretty well. Still walking a lot for work. Not checking sugars. Denies any hypoglycemic events. No open sores or wounds. No CP, palpitations, or headaches. Taking medication daily.    .. Active Ambulatory Problems    Diagnosis Date Noted   Contraception management 03/06/2013   Essential hypertension, benign 04/01/2013   Hypothyroidism 04/03/2013   Hypertriglyceridemia without hypercholesterolemia 09/23/2015   Adjustment disorder with mixed anxiety and depressed mood 06/22/2016   Controlled type 2 diabetes mellitus without complication, without long-term current use of insulin (HCC) 06/25/2017   Class 1 obesity due to excess calories with serious comorbidity and body mass index (BMI) of 34.0 to 34.9 in adult 06/25/2017   Primary osteoarthritis of both knees 12/10/2017   Breast mass, right 04/02/2018   Bilateral plantar fasciitis 04/07/2018   Muscle spasm of back 08/10/2020   Hyperlipidemia LDL goal <70 08/12/2020   Chronic rhinosinusitis 08/12/2020   Abscess, axilla 11/28/2020   Resolved Ambulatory Problems    Diagnosis Date Noted   Morbid obesity with BMI of 45.0-49.9, adult (HCC) 01/23/2012   Well adult exam 01/23/2012   Elevated blood pressure 03/06/2013   Elevated fasting glucose 06/25/2017   Past Medical History:  Diagnosis Date   Diabetes (HCC)    Hyperlipidemia    Hypothyroid     Review of Systems  All other systems reviewed and are negative.     Objective:   Physical Exam Vitals reviewed.  Constitutional:      Appearance: Normal appearance. She is obese.  HENT:     Head: Normocephalic.  Cardiovascular:     Rate and Rhythm: Normal rate and regular rhythm.     Pulses: Normal pulses.     Heart sounds: Normal heart sounds.   Pulmonary:     Effort: Pulmonary effort is normal.     Breath sounds: Normal breath sounds.  Musculoskeletal:     Right lower leg: No edema.     Left lower leg: No edema.  Neurological:     General: No focal deficit present.     Mental Status: She is alert and oriented to person, place, and time.  Psychiatric:        Mood and Affect: Mood normal.    .. Depression screen [facility] 2/9 03/01/2021 08/10/2020 10/28/2019 04/15/2019 02/12/2018  Decreased Interest 0 0 0 0 0  Down, Depressed, Hopeless 0 0 0 0 0  PHQ - 2 Score 0 0 0 0 0  Altered sleeping - - 0 0 -  Tired, decreased energy - - 2 0 -  Change in appetite - - 1 0 -  Feeling bad or failure about yourself  - - 2 0 -  Trouble concentrating - - 1 0 -  Moving slowly or fidgety/restless - - 1 0 -  Suicidal thoughts - - 0 0 -  PHQ-9 Score - - 7 0 -  Difficult doing work/chores - - Not difficult at all Not difficult at all -   .. Lab Results  Component Value Date   HGBA1C 5.1 03/01/2021        Assessment & Plan:   Tina Boone KitchenMarland KitchenAmadi was seen today for follow-up.  Diagnoses and all orders for this visit:  Controlled type 2  diabetes mellitus without complication, without long-term current use of insulin (HCC) -     POCT glycosylated hemoglobin (Hb A1C) -     Semaglutide,0.25 or 0.5MG /DOS, (OZEMPIC, 0.25 OR 0.5 MG/DOSE,) 2 MG/1.5ML SOPN; Inject 0.5 mg into the skin once a week. -     metFORMIN (GLUCOPHAGE XR) 750 MG 24 hr tablet; Take 1 tablet (750 mg total) by mouth daily with breakfast.  Essential hypertension, benign -     COMPLETE METABOLIC PANEL WITH GFR  Screening for HIV (human immunodeficiency virus) -     HIV antibody (with reflex)  Encounter for hepatitis C screening test for low risk patient -     Hepatitis C Antibody  Colon cancer screening -     Ambulatory referral to Gastroenterology  Class 1 obesity due to excess calories with serious comorbidity and body mass index (BMI) of 34.0 to 34.9 in adult  Hypothyroidism,  unspecified type -     levothyroxine (SYNTHROID) 125 MCG tablet; Take 1 tablet (125 mcg total) by mouth daily.   A1C to goal Due to cost stop synjardy Start just metformin and stop trulicity and see if ozempic is more affordable. Given copay card to activate.  BP close to goal. Continue ARB. On lipitor. Declined covid/pneumonia/flu shot.  Follow up in 3 months Fasting labs ordered. Needs colonoscopy. Ordered today. Refilled synthroid. TSH UTD.

## 2021-03-02 LAB — COMPLETE METABOLIC PANEL WITH GFR
AG Ratio: 1.4 (calc) (ref 1.0–2.5)
ALT: 16 U/L (ref 6–29)
AST: 16 U/L (ref 10–35)
Albumin: 3.9 g/dL (ref 3.6–5.1)
Alkaline phosphatase (APISO): 55 U/L (ref 31–125)
BUN: 19 mg/dL (ref 7–25)
CO2: 26 mmol/L (ref 20–32)
Calcium: 9.4 mg/dL (ref 8.6–10.2)
Chloride: 108 mmol/L (ref 98–110)
Creat: 0.87 mg/dL (ref 0.50–0.99)
Globulin: 2.8 g/dL (calc) (ref 1.9–3.7)
Glucose, Bld: 104 mg/dL — ABNORMAL HIGH (ref 65–99)
Potassium: 4.4 mmol/L (ref 3.5–5.3)
Sodium: 138 mmol/L (ref 135–146)
Total Bilirubin: 0.7 mg/dL (ref 0.2–1.2)
Total Protein: 6.7 g/dL (ref 6.1–8.1)
eGFR: 84 mL/min/{1.73_m2} (ref >=60)

## 2021-03-02 LAB — HIV ANTIBODY (ROUTINE TESTING W REFLEX): HIV 1&2 Ab, 4th Generation: NONREACTIVE

## 2021-03-02 LAB — HEPATITIS C ANTIBODY
Hepatitis C Ab: NONREACTIVE
SIGNAL TO CUT-OFF: 0.03 (ref <1.00)

## 2021-03-03 ENCOUNTER — Encounter: Payer: Self-pay | Admitting: Physician Assistant

## 2021-03-03 NOTE — Progress Notes (Signed)
Labs look great.

## 2021-03-21 MED ORDER — OZEMPIC (0.25 OR 0.5 MG/DOSE) 2 MG/1.5ML ~~LOC~~ SOPN: 0.5000 mg | PEN_INJECTOR | SUBCUTANEOUS | 2 refills | Status: DC

## 2021-03-21 NOTE — Addendum Note (Signed)
Addended bySilvio Pate on: 03/21/2021 04:48 PM   Modules accepted: Orders

## 2021-03-24 ENCOUNTER — Telehealth: Payer: Self-pay

## 2021-03-24 ENCOUNTER — Encounter: Payer: Self-pay | Admitting: Physician Assistant

## 2021-03-24 NOTE — Telephone Encounter (Signed)
Initiated Prior authorization [id number] Via: Covermymeds Case/Key:[id number] Status: Approved as of 03/24/21 Reason: Tina Boone is a covered medication,pt has high co-pay ,However I did reach out to the pharmacy and spoke to April, she told me pt has not reached her deductible for the year and her out of pocket cost for the medication is $900. Notified Pt via: Mychart

## 2021-03-31 ENCOUNTER — Other Ambulatory Visit: Payer: Self-pay | Admitting: Physician Assistant

## 2021-03-31 MED ORDER — TRULICITY 0.75 MG/0.5ML ~~LOC~~ SOAJ: 0.7500 mg | SUBCUTANEOUS | 2 refills | Status: DC

## 2021-03-31 NOTE — Progress Notes (Signed)
Sent trulicity

## 2021-04-18 ENCOUNTER — Encounter: Payer: Self-pay | Admitting: Physician Assistant

## 2021-04-18 DIAGNOSIS — Z3009 Encounter for other general counseling and advice on contraception: Secondary | ICD-10-CM

## 2021-04-18 MED ORDER — NORETHIN ACE-ETH ESTRAD-FE 1.5-30 MG-MCG PO TABS: 1.0000 | ORAL_TABLET | Freq: Every day | ORAL | 0 refills | Status: DC

## 2021-05-01 ENCOUNTER — Other Ambulatory Visit: Payer: Self-pay | Admitting: Physician Assistant

## 2021-05-01 DIAGNOSIS — N6310 Unspecified lump in the right breast, unspecified quadrant: Secondary | ICD-10-CM

## 2021-05-17 ENCOUNTER — Other Ambulatory Visit: Payer: Self-pay | Admitting: Physician Assistant

## 2021-05-17 DIAGNOSIS — E6609 Other obesity due to excess calories: Secondary | ICD-10-CM

## 2021-05-17 NOTE — Telephone Encounter (Signed)
Last written 02/17/2021 #90 no refills ?Last appt 03/01/2021 ?

## 2021-05-29 ENCOUNTER — Other Ambulatory Visit: Payer: Self-pay | Admitting: Physician Assistant

## 2021-05-31 ENCOUNTER — Encounter: Payer: Self-pay | Admitting: Physician Assistant

## 2021-05-31 ENCOUNTER — Other Ambulatory Visit (HOSPITAL_COMMUNITY)
Admission: RE | Admit: 2021-05-31 | Discharge: 2021-05-31 | Disposition: A | Discharge status: Home / Self Care | Payer: 59 | Source: Ambulatory Visit | Attending: Physician Assistant | Admitting: Physician Assistant

## 2021-05-31 ENCOUNTER — Ambulatory Visit (INDEPENDENT_AMBULATORY_CARE_PROVIDER_SITE_OTHER): Payer: 59 | Admitting: Physician Assistant

## 2021-05-31 VITALS — BP 155/76 | HR 91 | Ht 67.0 in | Wt 228.0 lb

## 2021-05-31 DIAGNOSIS — Z124 Encounter for screening for malignant neoplasm of cervix: Secondary | ICD-10-CM | POA: Diagnosis present

## 2021-05-31 DIAGNOSIS — Z1211 Encounter for screening for malignant neoplasm of colon: Secondary | ICD-10-CM | POA: Diagnosis not present

## 2021-05-31 DIAGNOSIS — E119 Type 2 diabetes mellitus without complications: Secondary | ICD-10-CM | POA: Diagnosis not present

## 2021-05-31 DIAGNOSIS — J301 Allergic rhinitis due to pollen: Secondary | ICD-10-CM

## 2021-05-31 DIAGNOSIS — Z6835 Body mass index (BMI) 35.0-35.9, adult: Secondary | ICD-10-CM

## 2021-05-31 DIAGNOSIS — Z6833 Body mass index (BMI) 33.0-33.9, adult: Secondary | ICD-10-CM

## 2021-05-31 DIAGNOSIS — J3489 Other specified disorders of nose and nasal sinuses: Secondary | ICD-10-CM

## 2021-05-31 DIAGNOSIS — E6609 Other obesity due to excess calories: Secondary | ICD-10-CM

## 2021-05-31 LAB — POCT GLYCOSYLATED HEMOGLOBIN (HGB A1C): Hemoglobin A1C: 5.2 % (ref 4.0–5.6)

## 2021-05-31 MED ORDER — MUPIROCIN 2 % EX OINT: TOPICAL_OINTMENT | CUTANEOUS | 2 refills | Status: AC

## 2021-05-31 MED ORDER — MONTELUKAST SODIUM 10 MG PO TABS: 10.0000 mg | ORAL_TABLET | Freq: Every day | ORAL | 3 refills | Status: DC

## 2021-05-31 NOTE — Patient Instructions (Signed)
Increase cozaar to 50mg   ?Goal BP is under 130/80 ?

## 2021-05-31 NOTE — Progress Notes (Signed)
? ?Established Patient Office Visit ? ?Subjective   ?Patient ID: Tina Boone, female    DOB: 1975/09/06  Age: 46 y.o. MRN: [id number] ? ?Chief Complaint  ?Patient presents with  ? Follow-up  ? Diabetes  ? ? ?HPI ?Pt is a 46 yo obese female with T2DM, hypothyroidism, seasonal allergies, HLD who needs refills.  ? ?She is staying very active on her job. She is compliant with medications. She continues to take phentermine and topamax for weight loss. She denies any hypoglycemic symptoms. She is not checking her sugars. No open sores or wounds. No CP, palpitations, headaches or vision changes. Her energy is pretty good. She is sleeping well.  ? ?She does have some recurrent sores in nose and runny alternating with congested nose. Flonase helps with congestion and running some but has been bad lately. Worse in the morning. Sores are very tender.  ? ?Pap is due. No problems or concerns.  ? ?Patient Active Problem List  ? Diagnosis Date Noted  ? Nasal vestibulitis 06/02/2021  ? Abscess, axilla 11/28/2020  ? Hyperlipidemia LDL goal <70 08/12/2020  ? Chronic rhinosinusitis 08/12/2020  ? Muscle spasm of back 08/10/2020  ? Bilateral plantar fasciitis 04/07/2018  ? Breast mass, right 04/02/2018  ? Primary osteoarthritis of both knees 12/10/2017  ? Controlled type 2 diabetes mellitus without complication, without long-term current use of insulin (HCC) 06/25/2017  ? Class 2 severe obesity due to excess calories with serious comorbidity and body mass index (BMI) of 35.0 to 35.9 in adult [facility]) 06/25/2017  ? Adjustment disorder with mixed anxiety and depressed mood 06/22/2016  ? Hypertriglyceridemia without hypercholesterolemia 09/23/2015  ? Hypothyroidism 04/03/2013  ? Essential hypertension, benign 04/01/2013  ? Contraception management 03/06/2013  ? ?Past Medical History:  ?Diagnosis Date  ? Diabetes (HCC)   ? Hyperlipidemia   ? Hypothyroid   ? ?No Known Allergies ?  ? ?ROS ?See HPI.  ?  ?Objective:  ?  ? ?BP (!)  155/76   Pulse 91   Ht 5\' 7"  (1.702 m)   Wt 228 lb (103.4 kg)   SpO2 100%   BMI 35.71 kg/m?  ?BP Readings from Last 3 Encounters:  ?05/31/21 (!) 155/76  ?03/01/21 139/76  ?11/28/20 128/82  ? ?  ? ?Physical Exam ?Vitals reviewed.  ?Constitutional:   ?   Appearance: Normal appearance. She is obese.  ?HENT:  ?   Head: Normocephalic.  ?   Right Ear: Tympanic membrane, ear canal and external ear normal. There is no impacted cerumen.  ?   Left Ear: Tympanic membrane, ear canal and external ear normal. There is no impacted cerumen.  ?   Nose: Nose normal.  ?   Mouth/Throat:  ?   Mouth: Mucous membranes are moist.  ?   Pharynx: No oropharyngeal exudate or posterior oropharyngeal erythema.  ?Eyes:  ?   Conjunctiva/sclera: Conjunctivae normal.  ?Neck:  ?   Vascular: No carotid bruit.  ?Cardiovascular:  ?   Rate and Rhythm: Normal rate and regular rhythm.  ?   Pulses: Normal pulses.  ?Pulmonary:  ?   Effort: Pulmonary effort is normal.  ?Abdominal:  ?   General: Bowel sounds are normal. There is no distension.  ?   Palpations: Abdomen is soft. There is no mass.  ?   Tenderness: There is no abdominal tenderness. There is no right CVA tenderness, left CVA tenderness, guarding or rebound.  ?   Hernia: No hernia is present.  ?Genitourinary: ?   General:  Normal vulva.  ?   Vagina: No vaginal discharge.  ?   Rectum: Normal.  ?   Comments: Friable cervix with no mass or adnexal tenderness ?Musculoskeletal:  ?   Right lower leg: No edema.  ?   Left lower leg: No edema.  ?Lymphadenopathy:  ?   Cervical: No cervical adenopathy.  ?Neurological:  ?   General: No focal deficit present.  ?   Mental Status: She is alert and oriented to person, place, and time.  ?Psychiatric:     ?   Mood and Affect: Mood normal.  ? ? ? ?Results for orders placed or performed in visit on 05/31/21  ?POCT glycosylated hemoglobin (Hb A1C)  ?Result Value Ref Range  ? Hemoglobin A1C 5.2 4.0 - 5.6 %  ? HbA1c POC (<> result, manual entry)    ? HbA1c, POC  (prediabetic range)    ? HbA1c, POC (controlled diabetic range)    ? ? ? ? ?  ?Assessment & Plan:  ?..Tina Boone was seen today for follow-up and diabetes. ? ?Diagnoses and all orders for this visit: ? ?Papanicolaou smear ?-     Cytology - PAP ? ?Controlled type 2 diabetes mellitus without complication, without long-term current use of insulin (HCC) ?-     POCT glycosylated hemoglobin (Hb A1C) ?-     Dulaglutide (TRULICITY) 0.75 MG/0.5ML SOPN; Inject 0.75 mg into the skin once a week. ? ?Colon cancer screening ?-     Ambulatory referral to Gastroenterology ? ?Class 2 severe obesity due to excess calories with serious comorbidity and body mass index (BMI) of 35.0 to 35.9 in adult [facility]) ? ?Nasal vestibulitis ?-     mupirocin ointment (BACTROBAN) 2 %; Apply to inside of each nares daily for 10 days then twice a week for maintenance. ? ?Seasonal allergic rhinitis due to pollen ?-     montelukast (SINGULAIR) 10 MG tablet; Take 1 tablet (10 mg total) by mouth at bedtime. ? ?Class 1 obesity due to excess calories with serious comorbidity and body mass index (BMI) of 33.0 to 33.9 in adult ?-     topiramate (TOPAMAX) 25 MG tablet; Take 1 tablet (25 mg total) by mouth 2 (two) times daily. ? ? ?a1C to goal ?Continue same medications ?Continue phentermine/topamax for weight loss(does not need refills)  ?BP not to goal. Increased cozaar goal BP under 130/80 ?On statin ?Foot and eye exam UtD ?Declines covid ?Flu/pneumonia UTD ? ?Pap done today ?Mammogram UTD ?Needs colonoscopy. Ordered today ? ?Discussed allergic rhinitis ?Continue with flonase and added singulair ?Bactroban for nasal sores ? ?Follow up in 32months.  ? ? ? ? ?Return in about 3 months (around 08/30/2021).  ? ? ?Tina Gaw, PA-C ? ?

## 2021-06-02 DIAGNOSIS — J3489 Other specified disorders of nose and nasal sinuses: Secondary | ICD-10-CM | POA: Insufficient documentation

## 2021-06-02 LAB — CYTOLOGY - PAP
Comment: NEGATIVE
Diagnosis: NEGATIVE
Diagnosis: REACTIVE
High risk HPV: NEGATIVE

## 2021-06-02 MED ORDER — TOPIRAMATE 25 MG PO TABS: 25.0000 mg | ORAL_TABLET | Freq: Two times a day (BID) | ORAL | 3 refills | Status: DC

## 2021-06-02 MED ORDER — TRULICITY 0.75 MG/0.5ML ~~LOC~~ SOAJ: 0.7500 mg | SUBCUTANEOUS | 2 refills | Status: DC

## 2021-06-05 NOTE — Progress Notes (Signed)
Normal cells and negative for HPV. Next pap in 5 years.

## 2021-06-17 ENCOUNTER — Encounter: Payer: Self-pay | Admitting: Physician Assistant

## 2021-06-20 MED ORDER — LOSARTAN POTASSIUM 50 MG PO TABS: 50.0000 mg | ORAL_TABLET | Freq: Every day | ORAL | 0 refills | Status: DC

## 2021-06-20 NOTE — Telephone Encounter (Signed)
I didn't want to send 50 mg tablets if it needs adjusted, please advise.  ?

## 2021-07-11 ENCOUNTER — Other Ambulatory Visit: Payer: Self-pay | Admitting: Physician Assistant

## 2021-07-11 DIAGNOSIS — Z3009 Encounter for other general counseling and advice on contraception: Secondary | ICD-10-CM

## 2021-08-10 ENCOUNTER — Other Ambulatory Visit: Payer: Self-pay | Admitting: Sports Medicine

## 2021-08-10 DIAGNOSIS — M170 Bilateral primary osteoarthritis of knee: Secondary | ICD-10-CM

## 2021-08-17 ENCOUNTER — Other Ambulatory Visit: Payer: Self-pay | Admitting: Physician Assistant

## 2021-08-17 DIAGNOSIS — E039 Hypothyroidism, unspecified: Secondary | ICD-10-CM

## 2021-08-17 DIAGNOSIS — E119 Type 2 diabetes mellitus without complications: Secondary | ICD-10-CM

## 2021-08-19 ENCOUNTER — Other Ambulatory Visit: Payer: Self-pay | Admitting: Physician Assistant

## 2021-08-19 DIAGNOSIS — E6609 Other obesity due to excess calories: Secondary | ICD-10-CM

## 2021-08-21 ENCOUNTER — Other Ambulatory Visit: Payer: Self-pay | Admitting: Physician Assistant

## 2021-08-21 ENCOUNTER — Encounter: Payer: Self-pay | Admitting: Physician Assistant

## 2021-08-21 DIAGNOSIS — M170 Bilateral primary osteoarthritis of knee: Secondary | ICD-10-CM

## 2021-08-21 DIAGNOSIS — E6609 Other obesity due to excess calories: Secondary | ICD-10-CM

## 2021-08-21 MED ORDER — CELECOXIB 200 MG PO CAPS: 200.0000 mg | ORAL_CAPSULE | Freq: Two times a day (BID) | ORAL | 0 refills | Status: DC | PRN

## 2021-08-21 NOTE — Telephone Encounter (Signed)
Last written 05/17/2021 #90 no refills Last appt 05/31/2021

## 2021-09-05 ENCOUNTER — Other Ambulatory Visit: Payer: Self-pay | Admitting: Physician Assistant

## 2021-09-05 DIAGNOSIS — E785 Hyperlipidemia, unspecified: Secondary | ICD-10-CM

## 2021-09-11 ENCOUNTER — Ambulatory Visit (INDEPENDENT_AMBULATORY_CARE_PROVIDER_SITE_OTHER): Payer: 59 | Admitting: Physician Assistant

## 2021-09-11 ENCOUNTER — Encounter: Payer: Self-pay | Admitting: Physician Assistant

## 2021-09-11 VITALS — BP 143/76 | HR 88 | Ht 67.0 in | Wt 231.0 lb

## 2021-09-11 DIAGNOSIS — D582 Other hemoglobinopathies: Secondary | ICD-10-CM | POA: Insufficient documentation

## 2021-09-11 DIAGNOSIS — E039 Hypothyroidism, unspecified: Secondary | ICD-10-CM | POA: Diagnosis not present

## 2021-09-11 DIAGNOSIS — E119 Type 2 diabetes mellitus without complications: Secondary | ICD-10-CM

## 2021-09-11 DIAGNOSIS — E785 Hyperlipidemia, unspecified: Secondary | ICD-10-CM

## 2021-09-11 DIAGNOSIS — Z6836 Body mass index (BMI) 36.0-36.9, adult: Secondary | ICD-10-CM

## 2021-09-11 DIAGNOSIS — M170 Bilateral primary osteoarthritis of knee: Secondary | ICD-10-CM

## 2021-09-11 LAB — POCT UA - MICROALBUMIN
Albumin/Creatinine Ratio, Urine, POC: <30
Creatinine, POC: 200 mg/dL
Microalbumin Ur, POC: 30 mg/L

## 2021-09-11 LAB — POCT GLYCOSYLATED HEMOGLOBIN (HGB A1C): HbA1c, POC (controlled diabetic range): 5.2 % (ref 0.0–7.0)

## 2021-09-11 MED ORDER — TRULICITY 0.75 MG/0.5ML ~~LOC~~ SOAJ: 0.7500 mg | SUBCUTANEOUS | 2 refills | Status: DC

## 2021-09-11 MED ORDER — PHENTERMINE HCL 15 MG PO CAPS: 15.0000 mg | ORAL_CAPSULE | Freq: Every morning | ORAL | 0 refills | Status: DC

## 2021-09-11 MED ORDER — METFORMIN HCL ER 750 MG PO TB24
750.0000 mg | ORAL_TABLET | Freq: Every day | ORAL | 0 refills | Status: DC
Start: 2021-09-11 — End: 2022-03-05

## 2021-09-11 MED ORDER — LEVOTHYROXINE SODIUM 125 MCG PO TABS: 125.0000 ug | ORAL_TABLET | Freq: Every day | ORAL | 3 refills | Status: DC

## 2021-09-11 MED ORDER — ATORVASTATIN CALCIUM 10 MG PO TABS
10.0000 mg | ORAL_TABLET | Freq: Every day | ORAL | 3 refills | Status: DC
Start: 2021-09-11 — End: 2022-10-03

## 2021-09-11 MED ORDER — CELECOXIB 200 MG PO CAPS: 200.0000 mg | ORAL_CAPSULE | Freq: Two times a day (BID) | ORAL | 3 refills | Status: DC | PRN
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
## 2021-09-12 LAB — CBC WITH DIFFERENTIAL/PLATELET
Absolute Monocytes: 586 cells/uL (ref 200–950)
Basophils Absolute: 93 cells/uL (ref 0–200)
Basophils Relative: 1 %
Eosinophils Absolute: 326 cells/uL (ref 15–500)
Eosinophils Relative: 3.5 %
HCT: 45.6 % — ABNORMAL HIGH (ref 35.0–45.0)
Hemoglobin: 15.2 g/dL (ref 11.7–15.5)
Lymphs Abs: 2641 cells/uL (ref 850–3900)
MCH: 30.3 pg (ref 27.0–33.0)
MCHC: 33.3 g/dL (ref 32.0–36.0)
MCV: 91 fL (ref 80.0–100.0)
MPV: 9.7 fL (ref 7.5–12.5)
Monocytes Relative: 6.3 %
Neutro Abs: 5654 cells/uL (ref 1500–7800)
Neutrophils Relative %: 60.8 %
Platelets: 202 10*3/uL (ref 140–400)
RBC: 5.01 10*6/uL (ref 3.80–5.10)
RDW: 12.9 % (ref 11.0–15.0)
Total Lymphocyte: 28.4 %
WBC: 9.3 10*3/uL (ref 3.8–10.8)

## 2021-09-12 LAB — COMPLETE METABOLIC PANEL WITH GFR
AG Ratio: 1.5 (calc) (ref 1.0–2.5)
ALT: 17 U/L (ref 6–29)
AST: 14 U/L (ref 10–35)
Albumin: 4 g/dL (ref 3.6–5.1)
Alkaline phosphatase (APISO): 54 U/L (ref 31–125)
BUN: 21 mg/dL (ref 7–25)
CO2: 25 mmol/L (ref 20–32)
Calcium: 9 mg/dL (ref 8.6–10.2)
Chloride: 105 mmol/L (ref 98–110)
Creat: 0.72 mg/dL (ref 0.50–0.99)
Globulin: 2.7 g/dL (calc) (ref 1.9–3.7)
Glucose, Bld: 101 mg/dL — ABNORMAL HIGH (ref 65–99)
Potassium: 4.5 mmol/L (ref 3.5–5.3)
Sodium: 138 mmol/L (ref 135–146)
Total Bilirubin: 0.5 mg/dL (ref 0.2–1.2)
Total Protein: 6.7 g/dL (ref 6.1–8.1)
eGFR: 104 mL/min/{1.73_m2} (ref >=60)

## 2021-09-12 LAB — LIPID PANEL W/REFLEX DIRECT LDL
Cholesterol: 159 mg/dL (ref <200)
HDL: 53 mg/dL (ref >=50)
LDL Cholesterol (Calc): 86 mg/dL (calc)
Non-HDL Cholesterol (Calc): 106 mg/dL (calc) (ref <130)
Total CHOL/HDL Ratio: 3 (calc) (ref <5.0)
Triglycerides: 108 mg/dL (ref <150)

## 2021-09-12 LAB — TSH: TSH: 2.97 mIU/L

## 2021-09-12 NOTE — Progress Notes (Signed)
Tina Boone,   Thyroid looks great.  LDL not to goal as was 1 year ago. Make sure taking cholesterol medications.  Kidney and liver look good.  Hemoglobin improved some.

## 2021-09-15 ENCOUNTER — Other Ambulatory Visit: Payer: Self-pay | Admitting: Physician Assistant

## 2021-10-02 ENCOUNTER — Other Ambulatory Visit: Payer: Self-pay | Admitting: Physician Assistant

## 2021-10-02 DIAGNOSIS — Z3009 Encounter for other general counseling and advice on contraception: Secondary | ICD-10-CM

## 2021-10-04 ENCOUNTER — Encounter: Payer: Self-pay | Admitting: Physician Assistant

## 2021-12-11 ENCOUNTER — Ambulatory Visit: Payer: 59 | Admitting: Physician Assistant

## 2021-12-22 ENCOUNTER — Other Ambulatory Visit: Payer: Self-pay | Admitting: Physician Assistant

## 2021-12-22 DIAGNOSIS — Z3009 Encounter for other general counseling and advice on contraception: Secondary | ICD-10-CM

## 2022-03-05 ENCOUNTER — Other Ambulatory Visit: Payer: Self-pay | Admitting: Physician Assistant

## 2022-03-05 DIAGNOSIS — Z3009 Encounter for other general counseling and advice on contraception: Secondary | ICD-10-CM

## 2022-03-05 DIAGNOSIS — E119 Type 2 diabetes mellitus without complications: Secondary | ICD-10-CM

## 2022-03-05 NOTE — Telephone Encounter (Signed)
Last appt 09/11/2021 Told to follow up in November  Last written 11/11/2021 #90 with no refills

## 2022-04-05 ENCOUNTER — Other Ambulatory Visit: Payer: Self-pay | Admitting: Physician Assistant

## 2022-04-05 DIAGNOSIS — E6609 Other obesity due to excess calories: Secondary | ICD-10-CM

## 2022-04-05 DIAGNOSIS — J301 Allergic rhinitis due to pollen: Secondary | ICD-10-CM

## 2022-04-05 DIAGNOSIS — Z6836 Body mass index (BMI) 36.0-36.9, adult: Secondary | ICD-10-CM

## 2022-04-05 DIAGNOSIS — E785 Hyperlipidemia, unspecified: Secondary | ICD-10-CM

## 2022-04-05 DIAGNOSIS — E039 Hypothyroidism, unspecified: Secondary | ICD-10-CM

## 2022-04-05 DIAGNOSIS — E119 Type 2 diabetes mellitus without complications: Secondary | ICD-10-CM

## 2022-04-16 ENCOUNTER — Ambulatory Visit: Payer: 59 | Admitting: Physician Assistant

## 2022-04-16 ENCOUNTER — Encounter: Payer: Self-pay | Admitting: Physician Assistant

## 2022-04-16 VITALS — BP 138/69 | HR 93 | Ht 67.0 in | Wt 254.0 lb

## 2022-04-16 DIAGNOSIS — E66812 Obesity, class 2: Secondary | ICD-10-CM

## 2022-04-16 DIAGNOSIS — E119 Type 2 diabetes mellitus without complications: Secondary | ICD-10-CM

## 2022-04-16 DIAGNOSIS — Z3009 Encounter for other general counseling and advice on contraception: Secondary | ICD-10-CM

## 2022-04-16 DIAGNOSIS — Z6839 Body mass index (BMI) 39.0-39.9, adult: Secondary | ICD-10-CM | POA: Diagnosis not present

## 2022-04-16 DIAGNOSIS — J301 Allergic rhinitis due to pollen: Secondary | ICD-10-CM

## 2022-04-16 DIAGNOSIS — I10 Essential (primary) hypertension: Secondary | ICD-10-CM

## 2022-04-16 LAB — POCT GLYCOSYLATED HEMOGLOBIN (HGB A1C): Hemoglobin A1C: 6 % — AB (ref 4.0–5.6)

## 2022-04-16 MED ORDER — PHENTERMINE HCL 15 MG PO CAPS: 15.0000 mg | ORAL_CAPSULE | Freq: Every morning | ORAL | 0 refills | Status: DC

## 2022-04-16 MED ORDER — TOPIRAMATE 25 MG PO TABS: 25.0000 mg | ORAL_TABLET | Freq: Two times a day (BID) | ORAL | 3 refills | Status: DC

## 2022-04-16 MED ORDER — LOSARTAN POTASSIUM 50 MG PO TABS: 50.0000 mg | ORAL_TABLET | Freq: Every day | ORAL | 0 refills | Status: DC

## 2022-04-16 MED ORDER — METFORMIN HCL ER 750 MG PO TB24: 750.0000 mg | ORAL_TABLET | Freq: Every day | ORAL | 0 refills | Status: DC

## 2022-04-16 MED ORDER — MONTELUKAST SODIUM 10 MG PO TABS: 10.0000 mg | ORAL_TABLET | Freq: Every day | ORAL | 3 refills | Status: DC

## 2022-04-16 MED ORDER — TIRZEPATIDE 5 MG/0.5ML ~~LOC~~ SOAJ: 5.0000 mg | SUBCUTANEOUS | 0 refills | Status: DC

## 2022-04-16 MED ORDER — NORETHIN ACE-ETH ESTRAD-FE 1.5-30 MG-MCG PO TABS: 1.0000 | ORAL_TABLET | Freq: Every day | ORAL | 3 refills | Status: DC
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
## 2022-04-17 ENCOUNTER — Encounter: Payer: Self-pay | Admitting: Physician Assistant

## 2022-04-17 NOTE — Patient Instructions (Signed)
Stop trulicity Start [employer]

## 2022-05-21 ENCOUNTER — Other Ambulatory Visit: Payer: Self-pay

## 2022-05-21 DIAGNOSIS — I10 Essential (primary) hypertension: Secondary | ICD-10-CM

## 2022-05-21 NOTE — Progress Notes (Signed)
Ordered labs through [employer] [facility] instead of [employer].

## 2022-05-22 LAB — CMP14+EGFR
ALT: 26 IU/L (ref 0–32)
AST: 24 IU/L (ref 0–40)
Albumin/Globulin Ratio: 1.4 (ref 1.2–2.2)
Albumin: 4.1 g/dL (ref 3.9–4.9)
Alkaline Phosphatase: 60 IU/L (ref 44–121)
BUN/Creatinine Ratio: 29 — ABNORMAL HIGH (ref 9–23)
BUN: 20 mg/dL (ref 6–24)
Bilirubin Total: 0.6 mg/dL (ref 0.0–1.2)
CO2: 18 mmol/L — ABNORMAL LOW (ref 20–29)
Calcium: 9.3 mg/dL (ref 8.7–10.2)
Chloride: 106 mmol/L (ref 96–106)
Creatinine, Ser: 0.7 mg/dL (ref 0.57–1.00)
Globulin, Total: 2.9 g/dL (ref 1.5–4.5)
Glucose: 94 mg/dL (ref 70–99)
Potassium: 4.3 mmol/L (ref 3.5–5.2)
Sodium: 138 mmol/L (ref 134–144)
Total Protein: 7 g/dL (ref 6.0–8.5)
eGFR: 108 mL/min/{1.73_m2} (ref >59)

## 2022-05-22 NOTE — Progress Notes (Signed)
Drink more water!  Cmp looks good.

## 2022-07-23 ENCOUNTER — Ambulatory Visit: Payer: 59 | Admitting: Physician Assistant

## 2022-07-23 VITALS — BP 142/80 | HR 92 | Ht 67.0 in | Wt 262.0 lb

## 2022-07-23 DIAGNOSIS — I10 Essential (primary) hypertension: Secondary | ICD-10-CM | POA: Diagnosis not present

## 2022-07-23 DIAGNOSIS — M170 Bilateral primary osteoarthritis of knee: Secondary | ICD-10-CM

## 2022-07-23 DIAGNOSIS — Z7984 Long term (current) use of oral hypoglycemic drugs: Secondary | ICD-10-CM

## 2022-07-23 DIAGNOSIS — E119 Type 2 diabetes mellitus without complications: Secondary | ICD-10-CM | POA: Diagnosis not present

## 2022-07-23 LAB — POCT GLYCOSYLATED HEMOGLOBIN (HGB A1C): Hemoglobin A1C: 6.1 % — AB (ref 4.0–5.6)

## 2022-07-23 MED ORDER — TIRZEPATIDE 5 MG/0.5ML ~~LOC~~ SOAJ
5.0000 mg | SUBCUTANEOUS | 0 refills | Status: DC
Start: 2022-07-23 — End: 2022-10-29

## 2022-07-23 MED ORDER — CELECOXIB 200 MG PO CAPS: 200.0000 mg | ORAL_CAPSULE | Freq: Two times a day (BID) | ORAL | 3 refills | Status: DC | PRN

## 2022-07-23 MED ORDER — METFORMIN HCL ER 750 MG PO TB24: 750.0000 mg | ORAL_TABLET | Freq: Every day | ORAL | 0 refills | Status: DC

## 2022-07-23 MED ORDER — LOSARTAN POTASSIUM 100 MG PO TABS
100.0000 mg | ORAL_TABLET | Freq: Every day | ORAL | 1 refills | Status: DC
Start: 2022-07-23 — End: 2022-10-03

## 2022-07-23 MED ORDER — PHENTERMINE HCL 15 MG PO CAPS
15.0000 mg | ORAL_CAPSULE | Freq: Every morning | ORAL | 0 refills | Status: DC
Start: 2022-07-23 — End: 2022-10-29
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
## 2022-08-13 ENCOUNTER — Other Ambulatory Visit: Payer: Self-pay | Admitting: Physician Assistant

## 2022-08-27 ENCOUNTER — Ambulatory Visit
Admission: EM | Admit: 2022-08-27 | Discharge: 2022-08-27 | Disposition: A | Discharge status: Home / Self Care | Payer: 59 | Attending: Family Medicine | Admitting: Family Medicine

## 2022-08-27 ENCOUNTER — Encounter: Payer: Self-pay | Admitting: Emergency Medicine

## 2022-08-27 DIAGNOSIS — N61 Mastitis without abscess: Secondary | ICD-10-CM | POA: Diagnosis not present

## 2022-08-27 DIAGNOSIS — N644 Mastodynia: Secondary | ICD-10-CM | POA: Diagnosis not present

## 2022-08-27 MED ORDER — DOXYCYCLINE HYCLATE 100 MG PO CAPS: 100.0000 mg | ORAL_CAPSULE | Freq: Two times a day (BID) | ORAL | 0 refills | Status: AC

## 2022-08-27 NOTE — Discharge Instructions (Addendum)
Advised patient to take medication as directed with food to completion.  Advised patient may take OTC Tylenol 1 g every 6 hours for left breast pain.  Encouraged increase daily water intake to 64 ounces per day while taking these medications.  Advised if symptoms worsen and/or unresolved please follow-up with PCP or here for further evaluation.

## 2022-08-27 NOTE — ED Triage Notes (Signed)
Patient states that she is having left sided breast pain possible infection x 4 days.  The breast is red, tender to touch, some drainage from area.  Applied heat and cold to area.  Patient has taken Tylenol.
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
## 2022-10-01 ENCOUNTER — Other Ambulatory Visit (INDEPENDENT_AMBULATORY_CARE_PROVIDER_SITE_OTHER): Payer: 59

## 2022-10-01 ENCOUNTER — Ambulatory Visit: Payer: 59 | Admitting: Sports Medicine

## 2022-10-01 DIAGNOSIS — M170 Bilateral primary osteoarthritis of knee: Secondary | ICD-10-CM

## 2022-10-01 MED ORDER — TRIAMCINOLONE ACETONIDE 40 MG/ML IJ SUSP
80.0000 mg | Freq: Once | INTRAMUSCULAR | Status: AC
Start: 2022-10-01 — End: 2022-10-01
  Administered 2022-10-01: 80 mg via INTRAMUSCULAR

## 2022-10-01 MED ORDER — DICLOFENAC SODIUM 75 MG PO TBEC
75.0000 mg | DELAYED_RELEASE_TABLET | Freq: Two times a day (BID) | ORAL | 3 refills | Status: DC
Start: 2022-10-01 — End: 2023-12-23

## 2022-10-01 NOTE — Progress Notes (Signed)
    Procedures performed today:    Procedure: Real-time Ultrasound Guided injection of the left knee Device: Samsung HS60  Verbal informed consent obtained.  Time-out conducted.  Noted no overlying erythema, induration, or other signs of local infection.  Skin prepped in a sterile fashion.  Local anesthesia: Topical Ethyl chloride.  With sterile technique and under real time ultrasound guidance:  Noted trace effusion, 1 cc Kenalog 40, 2 cc lidocaine, 2 cc bupivacaine injected easily Completed without difficulty  Advised to call if fevers/chills, erythema, induration, drainage, or persistent bleeding.  Images permanently stored and available for review in PACS.  Impression: Technically successful ultrasound guided injection.   Procedure: Real-time Ultrasound Guided injection of the right knee Device: Samsung HS60  Verbal informed consent obtained.  Time-out conducted.  Noted no overlying erythema, induration, or other signs of local infection.  Skin prepped in a sterile fashion.  Local anesthesia: Topical Ethyl chloride.  With sterile technique and under real time ultrasound guidance:  Noted trace effusion, 1 cc Kenalog 40, 2 cc lidocaine, 2 cc bupivacaine injected easily Completed without difficulty  Advised to call if fevers/chills, erythema, induration, drainage, or persistent bleeding.  Images permanently stored and available for review in PACS.  Impression: Technically successful ultrasound guided injection.  Independent interpretation of notes and tests performed by another provider:   None.  Brief History, Exam, Impression, and Recommendations:    Primary osteoarthritis of both knees Pleasant 47 year old female, known bilateral knee osteoarthritis, last injection was 2.5 years ago, she did well until recently. She does need a refill on her diclofenac, happy to do this now. We did inject both of her knees again today. We also discussed home physical therapy as well as  weight loss. She can discuss GLP-1's with her PCP it sounds like she has plateaued on phentermine/Topamax in the past. She declined additional home physical therapy, so we will just leave the door open for this, she can message me if she would like me to send her the PDF. Otherwise return to see me on an as-needed basis.    ____________________________________________ Ihor Austin. Benjamin Stain, M.D., ABFM., CAQSM., AME. Primary Care and Sports Medicine [facility]  Adjunct Professor of Family Medicine  [place] [employer]  Restaurant manager, fast food

## 2022-10-01 NOTE — Assessment & Plan Note (Signed)
Pleasant 47 year old female, known bilateral knee osteoarthritis, last injection was 2.5 years ago, she did well until recently. She does need a refill on her diclofenac, happy to do this now. We did inject both of her knees again today. We also discussed home physical therapy as well as weight loss. She can discuss GLP-1's with her PCP it sounds like she has plateaued on phentermine/Topamax in the past. She declined additional home physical therapy, so we will just leave the door open for this, she can message me if she would like me to send her the PDF. Otherwise return to see me on an as-needed basis.

## 2022-10-01 NOTE — Addendum Note (Signed)
Addended by: Holland Falling on: 10/01/2022 02:38 PM   Modules accepted: Orders

## 2022-10-03 ENCOUNTER — Other Ambulatory Visit: Payer: Self-pay | Admitting: Physician Assistant

## 2022-10-03 DIAGNOSIS — E785 Hyperlipidemia, unspecified: Secondary | ICD-10-CM

## 2022-10-03 DIAGNOSIS — I10 Essential (primary) hypertension: Secondary | ICD-10-CM

## 2022-10-05 ENCOUNTER — Other Ambulatory Visit: Payer: Self-pay | Admitting: Physician Assistant

## 2022-10-05 ENCOUNTER — Encounter: Payer: Self-pay | Admitting: Physician Assistant

## 2022-10-05 DIAGNOSIS — E039 Hypothyroidism, unspecified: Secondary | ICD-10-CM

## 2022-10-11 ENCOUNTER — Other Ambulatory Visit: Payer: Self-pay

## 2022-10-11 DIAGNOSIS — E039 Hypothyroidism, unspecified: Secondary | ICD-10-CM

## 2022-10-11 MED ORDER — LEVOTHYROXINE SODIUM 125 MCG PO TABS: 125.0000 ug | ORAL_TABLET | Freq: Every day | ORAL | 0 refills | Status: DC

## 2022-10-11 NOTE — Telephone Encounter (Signed)
Misread script- was for 2023.  Called patient and apologized for my error. 90 day supply sent to pharmacy until patient can be seen at upcoming appt.

## 2022-10-12 MED ORDER — LEVOTHYROXINE SODIUM 125 MCG PO TABS: 125.0000 ug | ORAL_TABLET | Freq: Every day | ORAL | 0 refills | Status: DC

## 2022-10-29 ENCOUNTER — Encounter: Payer: Self-pay | Admitting: Physician Assistant

## 2022-10-29 ENCOUNTER — Ambulatory Visit: Payer: 59 | Admitting: Physician Assistant

## 2022-10-29 VITALS — BP 142/88 | HR 87 | Ht 67.0 in | Wt 255.0 lb

## 2022-10-29 DIAGNOSIS — Z7984 Long term (current) use of oral hypoglycemic drugs: Secondary | ICD-10-CM | POA: Diagnosis not present

## 2022-10-29 DIAGNOSIS — Z6839 Body mass index (BMI) 39.0-39.9, adult: Secondary | ICD-10-CM | POA: Diagnosis not present

## 2022-10-29 DIAGNOSIS — Z23 Encounter for immunization: Secondary | ICD-10-CM | POA: Diagnosis not present

## 2022-10-29 DIAGNOSIS — Z1211 Encounter for screening for malignant neoplasm of colon: Secondary | ICD-10-CM

## 2022-10-29 DIAGNOSIS — E119 Type 2 diabetes mellitus without complications: Secondary | ICD-10-CM | POA: Diagnosis not present

## 2022-10-29 DIAGNOSIS — I10 Essential (primary) hypertension: Secondary | ICD-10-CM | POA: Diagnosis not present

## 2022-10-29 DIAGNOSIS — E039 Hypothyroidism, unspecified: Secondary | ICD-10-CM | POA: Diagnosis not present

## 2022-10-29 LAB — POCT UA - MICROALBUMIN
Albumin/Creatinine Ratio, Urine, POC: <30
Creatinine, POC: 100 mg/dL
Microalbumin Ur, POC: 80 mg/L

## 2022-10-29 LAB — POCT GLYCOSYLATED HEMOGLOBIN (HGB A1C): Hemoglobin A1C: 6.1 % — AB (ref 4.0–5.6)

## 2022-10-29 MED ORDER — METFORMIN HCL ER 750 MG PO TB24
750.0000 mg | ORAL_TABLET | Freq: Every day | ORAL | 0 refills | Status: DC
Start: 2022-10-29 — End: 2023-03-04

## 2022-10-29 MED ORDER — PHENTERMINE HCL 15 MG PO CAPS
15.0000 mg | ORAL_CAPSULE | Freq: Every morning | ORAL | 0 refills | Status: DC
Start: 2022-10-29 — End: 2023-02-07

## 2022-10-29 MED ORDER — MOUNJARO 7.5 MG/0.5ML ~~LOC~~ SOAJ
7.5000 mg | SUBCUTANEOUS | 0 refills | Status: DC
Start: 2022-10-29 — End: 2023-03-04

## 2022-10-29 MED ORDER — HYDROCHLOROTHIAZIDE 12.5 MG PO TABS
12.5000 mg | ORAL_TABLET | Freq: Every day | ORAL | 0 refills | Status: DC
Start: 2022-10-29 — End: 2023-02-07
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
## 2022-10-30 LAB — CMP14+EGFR
ALT: 31 IU/L (ref 0–32)
AST: 27 IU/L (ref 0–40)
Albumin: 3.9 g/dL (ref 3.9–4.9)
Alkaline Phosphatase: 59 IU/L (ref 44–121)
BUN/Creatinine Ratio: 19 (ref 9–23)
BUN: 14 mg/dL (ref 6–24)
Bilirubin Total: 0.6 mg/dL (ref 0.0–1.2)
CO2: 19 mmol/L — ABNORMAL LOW (ref 20–29)
Calcium: 9.1 mg/dL (ref 8.7–10.2)
Chloride: 105 mmol/L (ref 96–106)
Creatinine, Ser: 0.75 mg/dL (ref 0.57–1.00)
Globulin, Total: 2.7 g/dL (ref 1.5–4.5)
Glucose: 111 mg/dL — ABNORMAL HIGH (ref 70–99)
Potassium: 4.4 mmol/L (ref 3.5–5.2)
Sodium: 138 mmol/L (ref 134–144)
Total Protein: 6.6 g/dL (ref 6.0–8.5)
eGFR: 99 mL/min/{1.73_m2} (ref >59)

## 2022-10-30 NOTE — Progress Notes (Signed)
No concerns with labs

## 2022-11-08 ENCOUNTER — Telehealth: Payer: Self-pay

## 2022-11-08 NOTE — Telephone Encounter (Signed)
Patient states [name] is needing a prior authorization.   Thank you [name]!!

## 2022-11-09 ENCOUNTER — Telehealth: Payer: Self-pay

## 2022-11-09 NOTE — Telephone Encounter (Addendum)
Initiated Prior authorization WUJ:[id number] 7.5MG /0.5ML auto-injectors Resubmission placed  Via: Covermymeds Case/Key:[id number] Status: denied  as of 11/09/22 Reason:Your plan only covers this drug when you meet one of these options: A) You have tried other products your plan covers (preferred products), and they did not work well for you, or B) Your doctor gives [place] a medical reason you cannot take those other products. For your plan, you may need to try up to three preferred products. We have denied your request because you do not meet any of these conditions. We reviewed the information we had. Your request has been denied. Your doctor can send [place] any new or missing information for [place] to review. The preferred products for your plan are: liraglutide, Victoza, Trulicity. Your doctor may need to get approval from your plan for preferred products. For this drug, you may have to meet other criteria. You can request the drug policy for more details. You can also request other plan documents for your review. Notified Pt via: Mychart

## 2022-11-12 ENCOUNTER — Ambulatory Visit (INDEPENDENT_AMBULATORY_CARE_PROVIDER_SITE_OTHER): Payer: 59

## 2022-11-12 VITALS — BP 125/68 | HR 80

## 2022-11-12 DIAGNOSIS — I10 Essential (primary) hypertension: Secondary | ICD-10-CM

## 2022-11-12 NOTE — Progress Notes (Signed)
   Established Patient Office Visit  Subjective   Patient ID: Tina Boone, female    DOB: Jul 20, 1975  Age: 47 y.o. MRN: [id number]  Chief Complaint  Patient presents with   Hypertension    HPI  Cheryal Salas Geil is here for blood pressure check. She did start the hydrochlorothiazide without any problems. Denies chest pain, shortness of breath or dizziness.   ROS    Objective:     BP 125/68   Pulse 80   SpO2 100%    Physical Exam   No results found for any visits on 11/12/22.    The 10-year ASCVD risk score (Arnett DK, et al., 2019) is: 1.8%    Assessment & Plan:  HTN - Blood pressure within normal range. Per Lesly Rubenstein, patient advised to continue with current medications as directed.   Problem List Items Addressed This Visit       Unprioritized   Essential hypertension, benign - Primary    Return in about 3 months (around 02/12/2023) for HTN with Jade. Earna Coder, Janalyn Harder, CMA

## 2022-11-21 MED ORDER — TRULICITY 3 MG/0.5ML ~~LOC~~ SOAJ
3.0000 mg | SUBCUTANEOUS | 0 refills | Status: DC
Start: 2022-11-21 — End: 2023-06-28

## 2022-11-21 NOTE — Telephone Encounter (Signed)
Patient states Trulicity has been too expensive in the past but she is willing to try again to see if this is  cheaper for her now.

## 2022-11-21 NOTE — Telephone Encounter (Signed)
She was only trulicity before and was not as effective for weight loss and DM control per patient does that matter? If not I guess call and tell patient we have to switch to trulicity

## 2022-11-21 NOTE — Addendum Note (Signed)
Addended by: Jomarie Longs on: 11/21/2022 04:26 PM   Modules accepted: Orders

## 2023-02-02 ENCOUNTER — Other Ambulatory Visit: Payer: Self-pay | Admitting: Physician Assistant

## 2023-02-02 DIAGNOSIS — I10 Essential (primary) hypertension: Secondary | ICD-10-CM

## 2023-02-02 DIAGNOSIS — E66812 Obesity, class 2: Secondary | ICD-10-CM

## 2023-02-02 DIAGNOSIS — J301 Allergic rhinitis due to pollen: Secondary | ICD-10-CM

## 2023-02-07 ENCOUNTER — Other Ambulatory Visit: Payer: Self-pay | Admitting: Emergency Medicine

## 2023-02-07 DIAGNOSIS — E66812 Obesity, class 2: Secondary | ICD-10-CM

## 2023-02-11 ENCOUNTER — Ambulatory Visit: Payer: 59 | Admitting: Physician Assistant

## 2023-02-18 ENCOUNTER — Ambulatory Visit: Payer: 59 | Admitting: Physician Assistant

## 2023-02-18 DIAGNOSIS — E119 Type 2 diabetes mellitus without complications: Secondary | ICD-10-CM

## 2023-02-18 DIAGNOSIS — E039 Hypothyroidism, unspecified: Secondary | ICD-10-CM

## 2023-02-18 DIAGNOSIS — I10 Essential (primary) hypertension: Secondary | ICD-10-CM

## 2023-03-04 ENCOUNTER — Ambulatory Visit: Payer: No Typology Code available for payment source | Admitting: Physician Assistant

## 2023-03-04 ENCOUNTER — Encounter: Payer: Self-pay | Admitting: Physician Assistant

## 2023-03-04 ENCOUNTER — Other Ambulatory Visit: Payer: Self-pay | Admitting: Physician Assistant

## 2023-03-04 VITALS — BP 132/78 | HR 97 | Ht 67.0 in | Wt 260.2 lb

## 2023-03-04 DIAGNOSIS — E66813 Obesity, class 3: Secondary | ICD-10-CM

## 2023-03-04 DIAGNOSIS — E039 Hypothyroidism, unspecified: Secondary | ICD-10-CM

## 2023-03-04 DIAGNOSIS — I10 Essential (primary) hypertension: Secondary | ICD-10-CM

## 2023-03-04 DIAGNOSIS — Z6841 Body Mass Index (BMI) 40.0 and over, adult: Secondary | ICD-10-CM

## 2023-03-04 DIAGNOSIS — Z1211 Encounter for screening for malignant neoplasm of colon: Secondary | ICD-10-CM

## 2023-03-04 DIAGNOSIS — Z1231 Encounter for screening mammogram for malignant neoplasm of breast: Secondary | ICD-10-CM

## 2023-03-04 DIAGNOSIS — E119 Type 2 diabetes mellitus without complications: Secondary | ICD-10-CM

## 2023-03-04 DIAGNOSIS — Z7984 Long term (current) use of oral hypoglycemic drugs: Secondary | ICD-10-CM

## 2023-03-04 DIAGNOSIS — Z3009 Encounter for other general counseling and advice on contraception: Secondary | ICD-10-CM

## 2023-03-04 DIAGNOSIS — E785 Hyperlipidemia, unspecified: Secondary | ICD-10-CM

## 2023-03-04 LAB — POCT GLYCOSYLATED HEMOGLOBIN (HGB A1C): HbA1c, POC (controlled diabetic range): 6 % (ref 0.0–7.0)

## 2023-03-04 MED ORDER — METFORMIN HCL ER 750 MG PO TB24: 750.0000 mg | ORAL_TABLET | Freq: Every day | ORAL | 3 refills | Status: AC

## 2023-03-04 MED ORDER — HYDROCHLOROTHIAZIDE 12.5 MG PO TABS: 12.5000 mg | ORAL_TABLET | Freq: Every day | ORAL | 3 refills | Status: AC

## 2023-03-04 MED ORDER — TOPIRAMATE 25 MG PO TABS: 25.0000 mg | ORAL_TABLET | Freq: Two times a day (BID) | ORAL | 3 refills | Status: AC

## 2023-03-04 MED ORDER — PHENTERMINE HCL 15 MG PO CAPS: 15.0000 mg | ORAL_CAPSULE | Freq: Every morning | ORAL | 0 refills | Status: DC
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
## 2023-03-25 LAB — HM DIABETES EYE EXAM

## 2023-04-24 ENCOUNTER — Other Ambulatory Visit: Payer: Self-pay | Admitting: Physician Assistant

## 2023-04-24 DIAGNOSIS — E785 Hyperlipidemia, unspecified: Secondary | ICD-10-CM

## 2023-04-24 DIAGNOSIS — I10 Essential (primary) hypertension: Secondary | ICD-10-CM

## 2023-04-24 DIAGNOSIS — Z3009 Encounter for other general counseling and advice on contraception: Secondary | ICD-10-CM

## 2023-05-23 ENCOUNTER — Other Ambulatory Visit: Payer: Self-pay | Admitting: Physician Assistant

## 2023-05-23 DIAGNOSIS — E66813 Obesity, class 3: Secondary | ICD-10-CM

## 2023-06-03 ENCOUNTER — Ambulatory Visit: Payer: No Typology Code available for payment source | Admitting: Physician Assistant

## 2023-06-17 ENCOUNTER — Encounter (HOSPITAL_COMMUNITY): Payer: Self-pay

## 2023-06-24 ENCOUNTER — Ambulatory Visit: Admitting: Physician Assistant

## 2023-06-24 ENCOUNTER — Ambulatory Visit

## 2023-06-24 ENCOUNTER — Ambulatory Visit: Payer: Self-pay | Admitting: Physician Assistant

## 2023-06-24 ENCOUNTER — Encounter: Payer: Self-pay | Admitting: Physician Assistant

## 2023-06-24 VITALS — BP 135/80 | HR 80 | Ht 67.0 in | Wt 266.0 lb

## 2023-06-24 DIAGNOSIS — S93402A Sprain of unspecified ligament of left ankle, initial encounter: Secondary | ICD-10-CM

## 2023-06-24 DIAGNOSIS — M25572 Pain in left ankle and joints of left foot: Secondary | ICD-10-CM

## 2023-06-24 DIAGNOSIS — W1842XA Slipping, tripping and stumbling without falling due to stepping into hole or opening, initial encounter: Secondary | ICD-10-CM | POA: Diagnosis not present

## 2023-06-24 DIAGNOSIS — E785 Hyperlipidemia, unspecified: Secondary | ICD-10-CM

## 2023-06-24 DIAGNOSIS — Z7985 Long-term (current) use of injectable non-insulin antidiabetic drugs: Secondary | ICD-10-CM

## 2023-06-24 DIAGNOSIS — M79672 Pain in left foot: Secondary | ICD-10-CM

## 2023-06-24 DIAGNOSIS — I10 Essential (primary) hypertension: Secondary | ICD-10-CM

## 2023-06-24 DIAGNOSIS — Z23 Encounter for immunization: Secondary | ICD-10-CM

## 2023-06-24 DIAGNOSIS — E119 Type 2 diabetes mellitus without complications: Secondary | ICD-10-CM

## 2023-06-24 DIAGNOSIS — Z7984 Long term (current) use of oral hypoglycemic drugs: Secondary | ICD-10-CM

## 2023-06-24 DIAGNOSIS — E039 Hypothyroidism, unspecified: Secondary | ICD-10-CM

## 2023-06-24 DIAGNOSIS — Z1211 Encounter for screening for malignant neoplasm of colon: Secondary | ICD-10-CM

## 2023-06-24 LAB — POCT GLYCOSYLATED HEMOGLOBIN (HGB A1C): Hemoglobin A1C: 6.3 % — AB (ref 4.0–5.6)

## 2023-06-24 NOTE — Progress Notes (Signed)
 Established Patient Office Visit  Subjective   Patient ID: Tina Boone, female    DOB: 1975-03-25  Age: 48 y.o. MRN: [id number]  Chief Complaint  Patient presents with   Medical Management of Chronic Issues    Last A1c was 6.1    HPI Pt is a 48 yo obese female with T2DM, HTN, Hypothyroidism, HLD who presents to the clinic for 3 month follow up.   Pt is taking her medications daily. She denies any hypoglycemic events. She is not checking her sugars. She is active with her job, walking a lot, but does not exercise. She has recently started to gain weight despite being on phentermine  and topamax  daily. She denies any CP, palpitations, headaches or vision changes.   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 She has been wearing supportive sneakers and helps. No more ankle pain but there is swelling. Pain is over dorsal foot and walking is still painful.    ROS See HPI.    Objective:     BP 135/80   Pulse 80   Ht 5\' 7"  (1.702 m)   Wt 266 lb (120.7 kg)   SpO2 99%   BMI 41.66 kg/m  BP Readings from Last 3 Encounters:  06/24/23 135/80  03/04/23 132/78  11/12/22 125/68   Wt Readings from Last 3 Encounters:  06/24/23 266 lb (120.7 kg)  03/04/23 260 lb 4 oz (118 kg)  10/29/22 255 lb (115.7 kg)      Physical Exam Constitutional:      Appearance: Normal appearance. She is obese.  HENT:     Head: Normocephalic.  Cardiovascular:     Rate and Rhythm: Normal rate and regular rhythm.  Pulmonary:     Effort: Pulmonary effort is normal.     Breath sounds: Normal breath sounds.  Musculoskeletal:     Left lower leg: Edema present.     Comments: Left ankle edema over lateral malleolus.  NROM of left ankle No pain to palpation over lateral malleolus.  Pain over dorsal midfoot to palpation with no redness, swelling, bruising.   Neurological:     General: No focal deficit present.     Mental Status: She is alert and oriented to  person, place, and time.  Psychiatric:        Mood and Affect: Mood normal.    .. Lab Results  Component Value Date   HGBA1C 6.3 (A) 06/24/2023       The 10-year ASCVD risk score (Arnett DK, et al., 2019) is: 2.6%    Assessment & Plan:  Aaron AasAaron AasDevlyn was seen today for medical management of chronic issues.  Diagnoses and all orders for this visit:  Controlled type 2 diabetes mellitus without complication, without long-term current use of insulin (HCC) -     POCT HgB A1C -     CMP14+EGFR -     CBC w/Diff/Platelet  Left foot pain -     DG Foot Complete Left; Future -     DG Ankle Complete Left; Future  Sprain of left ankle, unspecified ligament, initial encounter -     DG Foot Complete Left; Future -     DG Ankle Complete Left; Future  Colon cancer screening -     Cologuard  Essential hypertension, benign -     CMP14+EGFR -     CBC w/Diff/Platelet  Hypothyroidism, unspecified type -     TSH -     CBC w/Diff/Platelet  Dyslipidemia (high LDL; low HDL) -     Lipid panel -     CBC w/Diff/Platelet  Need for Tdap vaccination -     Tdap vaccine greater than or equal to 7yo IM   A1C under 7 and to goal Continue trulicity  and metformin  UTD eye and foot exam BP close to goal On statin Colonoscopy and mammogram ordered Tdap given today Covid booster declined Follow up in 3 months  Place in post op shoe today and encouraged to get compression sleeve for ankle stability Will get xrays today Consider walking boot but 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  Continue to rest, ice, compression, and elevation.  Follow up in 2weeks or sooner if needed.    Darby Shadwick, PA-C

## 2023-06-24 NOTE — Progress Notes (Signed)
 Arthritis in 2nd toe.  Heel spurs noted.  No acute fracture seen.  Stay in boot in follow up in 2 weeks.

## 2023-06-25 LAB — LIPID PANEL
Chol/HDL Ratio: 3.5 ratio (ref 0.0–4.4)
Cholesterol, Total: 177 mg/dL (ref 100–199)
HDL: 50 mg/dL (ref >39)
LDL Chol Calc (NIH): 100 mg/dL — ABNORMAL HIGH (ref 0–99)
Triglycerides: 153 mg/dL — ABNORMAL HIGH (ref 0–149)
VLDL Cholesterol Cal: 27 mg/dL (ref 5–40)

## 2023-06-25 LAB — CMP14+EGFR
ALT: 24 IU/L (ref 0–32)
AST: 23 IU/L (ref 0–40)
Albumin: 4.2 g/dL (ref 3.9–4.9)
Alkaline Phosphatase: 63 IU/L (ref 44–121)
BUN/Creatinine Ratio: 16 (ref 9–23)
BUN: 12 mg/dL (ref 6–24)
Bilirubin Total: 0.5 mg/dL (ref 0.0–1.2)
CO2: 19 mmol/L — ABNORMAL LOW (ref 20–29)
Calcium: 9.6 mg/dL (ref 8.7–10.2)
Chloride: 103 mmol/L (ref 96–106)
Creatinine, Ser: 0.77 mg/dL (ref 0.57–1.00)
Globulin, Total: 2.7 g/dL (ref 1.5–4.5)
Glucose: 104 mg/dL — ABNORMAL HIGH (ref 70–99)
Potassium: 5.1 mmol/L (ref 3.5–5.2)
Sodium: 138 mmol/L (ref 134–144)
Total Protein: 6.9 g/dL (ref 6.0–8.5)
eGFR: 96 mL/min/{1.73_m2} (ref >59)

## 2023-06-25 LAB — CBC WITH DIFFERENTIAL/PLATELET
Basophils Absolute: 0.1 10*3/uL (ref 0.0–0.2)
Basos: 1 %
EOS (ABSOLUTE): 0.5 10*3/uL — ABNORMAL HIGH (ref 0.0–0.4)
Eos: 5 %
Hematocrit: 48 % — ABNORMAL HIGH (ref 34.0–46.6)
Hemoglobin: 15.8 g/dL (ref 11.1–15.9)
Immature Grans (Abs): 0 10*3/uL (ref 0.0–0.1)
Immature Granulocytes: 0 %
Lymphocytes Absolute: 3 10*3/uL (ref 0.7–3.1)
Lymphs: 30 %
MCH: 30.4 pg (ref 26.6–33.0)
MCHC: 32.9 g/dL (ref 31.5–35.7)
MCV: 92 fL (ref 79–97)
Monocytes Absolute: 0.6 10*3/uL (ref 0.1–0.9)
Monocytes: 7 %
Neutrophils Absolute: 5.7 10*3/uL (ref 1.4–7.0)
Neutrophils: 57 %
Platelets: 312 10*3/uL (ref 150–450)
RBC: 5.2 x10E6/uL (ref 3.77–5.28)
RDW: 13.2 % (ref 11.7–15.4)
WBC: 9.9 10*3/uL (ref 3.4–10.8)

## 2023-06-25 LAB — TSH: TSH: 9.77 u[IU]/mL — ABNORMAL HIGH (ref 0.450–4.500)

## 2023-06-25 NOTE — Progress Notes (Signed)
 Tina Boone,   LDL not quite to goal of under 70. Are you taking lipitor daily? If so I would like to increase to 20mg  daily. Are you ok with that?   Thyroid  is showing in the hypothyroid range. Are you taking synthroid  daily? If so we need to increase and recheck in 6 weeks.

## 2023-06-28 ENCOUNTER — Encounter: Payer: Self-pay | Admitting: Physician Assistant

## 2023-06-28 ENCOUNTER — Telehealth: Payer: Self-pay

## 2023-06-28 ENCOUNTER — Ambulatory Visit: Payer: Self-pay

## 2023-06-28 DIAGNOSIS — E785 Hyperlipidemia, unspecified: Secondary | ICD-10-CM | POA: Insufficient documentation

## 2023-06-28 MED ORDER — ATORVASTATIN CALCIUM 20 MG PO TABS: 20.0000 mg | ORAL_TABLET | Freq: Every day | ORAL | 3 refills | Status: AC

## 2023-06-28 MED ORDER — TRULICITY 3 MG/0.5ML ~~LOC~~ SOAJ: 3.0000 mg | SUBCUTANEOUS | 0 refills | Status: DC

## 2023-06-28 MED ORDER — LEVOTHYROXINE SODIUM 150 MCG PO TABS: 150.0000 ug | ORAL_TABLET | Freq: Every day | ORAL | 1 refills | Status: DC

## 2023-06-28 NOTE — Telephone Encounter (Signed)
 Patient states her pharmacy has informed her that the Trulicity  is requiring a Prior authorization  Forwarding to PA team to complete this.

## 2023-06-28 NOTE — Telephone Encounter (Signed)
 Copied from CRM [id number]. Topic: Clinical - Medication Question >> Jun 28, 2023 12:20 PM Kevelyn M wrote: Reason for CRM: Madelyne Schiff with [facility] pharmacy needs the diagnose code for Dulaglutide  (TRULICITY ) 3 MG/0.5ML SOAJ. [phone] Please call back.

## 2023-06-28 NOTE — Telephone Encounter (Signed)
 Advised pharmacy that the code is E11.9.

## 2023-06-28 NOTE — Addendum Note (Signed)
 Addended by: Araceli Knight on: 06/28/2023 03:55 PM   Modules accepted: Orders

## 2023-07-02 ENCOUNTER — Other Ambulatory Visit (HOSPITAL_COMMUNITY): Payer: Self-pay

## 2023-07-02 ENCOUNTER — Telehealth: Payer: Self-pay

## 2023-07-02 NOTE — Telephone Encounter (Signed)
 Pharmacy Patient Advocate Encounter   Received notification from Patient Pharmacy that prior authorization for Trulicity  3mg /0.1ml is required/requested.   Insurance verification completed.   The patient is insured through [employer] .   Per test claim: PA required; PA submitted to above mentioned insurance via CoverMyMeds Key/confirmation #/EOC [id number] Status is pending

## 2023-07-02 NOTE — Telephone Encounter (Signed)
 Pharmacy Patient Advocate Encounter  Received notification from [employer]  that Prior Authorization for Trulicity  3mg /0.49ml has been APPROVED from 07/02/23 to 07/01/24. Spoke to pharmacy to process.Copay is $25. Insurance will allow a 28 day supply at a time.    PA #/Case ID/Reference #: [id number]  Spoke with [facility] pharmacy and per Madelyne Schiff, they will have to order the Trulicity .

## 2023-07-24 LAB — COLOGUARD: COLOGUARD: NEGATIVE

## 2023-07-26 NOTE — Progress Notes (Signed)
Cologuard is negative. Re-screen in 3 years.

## 2023-07-29 ENCOUNTER — Other Ambulatory Visit: Payer: Self-pay | Admitting: Physician Assistant

## 2023-07-29 DIAGNOSIS — Z3009 Encounter for other general counseling and advice on contraception: Secondary | ICD-10-CM

## 2023-09-23 ENCOUNTER — Encounter: Payer: Self-pay | Admitting: Physician Assistant

## 2023-09-23 ENCOUNTER — Ambulatory Visit: Admitting: Physician Assistant

## 2023-09-23 VITALS — BP 138/78 | HR 78 | Ht 67.0 in | Wt 265.0 lb

## 2023-09-23 DIAGNOSIS — E785 Hyperlipidemia, unspecified: Secondary | ICD-10-CM

## 2023-09-23 DIAGNOSIS — I10 Essential (primary) hypertension: Secondary | ICD-10-CM

## 2023-09-23 DIAGNOSIS — M170 Bilateral primary osteoarthritis of knee: Secondary | ICD-10-CM

## 2023-09-23 DIAGNOSIS — E66813 Obesity, class 3: Secondary | ICD-10-CM | POA: Diagnosis not present

## 2023-09-23 DIAGNOSIS — Z7984 Long term (current) use of oral hypoglycemic drugs: Secondary | ICD-10-CM

## 2023-09-23 DIAGNOSIS — E119 Type 2 diabetes mellitus without complications: Secondary | ICD-10-CM | POA: Diagnosis not present

## 2023-09-23 DIAGNOSIS — E039 Hypothyroidism, unspecified: Secondary | ICD-10-CM | POA: Diagnosis not present

## 2023-09-23 DIAGNOSIS — Z6841 Body Mass Index (BMI) 40.0 and over, adult: Secondary | ICD-10-CM

## 2023-09-23 LAB — POCT GLYCOSYLATED HEMOGLOBIN (HGB A1C): Hemoglobin A1C: 6.2 % — AB (ref 4.0–5.6)

## 2023-09-23 NOTE — Progress Notes (Signed)
 Established Patient Office Visit  Subjective   Patient ID: Tina Boone, female    DOB: 1975/10/31  Age: 48 y.o. MRN: [id number]  Chief Complaint  Patient presents with   Medical Management of Chronic Issues    HPI Pt is a 48 yo obese female with T2DM, HTN, HLD, chronic knee pain who presents to the clinic for 3 month follow up.   Pt is not checking her sugars. She denies any hypoglycemic symptoms. She is active with job, she walks all day. She denies any exercise. Her knees ache a lot but celebrex  helps. She denies any CP, palpitations, headaches or vision changes. She is compliant with her medications. Overall she feels good. She needs medication refills today. She is taking phentermine  and topamax  for weight. It is helping to keep her stable but not helping her lose any more.   Review of Systems  All other systems reviewed and are negative.     Objective:     BP 138/78   Pulse 78   Ht 5' 7 (1.702 m)   Wt 265 lb (120.2 kg)   SpO2 99%   BMI 41.50 kg/m  BP Readings from Last 3 Encounters:  09/23/23 138/78  06/24/23 135/80  03/04/23 132/78   Wt Readings from Last 3 Encounters:  09/23/23 265 lb (120.2 kg)  06/24/23 266 lb (120.7 kg)  03/04/23 260 lb 4 oz (118 kg)    .SABRA Results for orders placed or performed in visit on 09/23/23  POCT HgB A1C   Collection Time: 09/23/23  1:50 PM  Result Value Ref Range   Hemoglobin A1C 6.2 (A) 4.0 - 5.6 %   HbA1c POC (<> result, manual entry)     HbA1c, POC (prediabetic range)     HbA1c, POC (controlled diabetic range)       Physical Exam Constitutional:      Appearance: Normal appearance. She is obese.  HENT:     Head: Normocephalic.  Cardiovascular:     Rate and Rhythm: Normal rate and regular rhythm.  Pulmonary:     Effort: Pulmonary effort is normal.     Breath sounds: Normal breath sounds.  Musculoskeletal:     Right lower leg: No edema.     Left lower leg: No edema.  Neurological:     General: No  focal deficit present.     Mental Status: She is alert and oriented to person, place, and time.  Psychiatric:        Mood and Affect: Mood normal.      The 10-year ASCVD risk score (Arnett DK, et al., 2019) is: 3%    Assessment & Plan:  .Janean was seen today for medical management of chronic issues.  Diagnoses and all orders for this visit:  Controlled type 2 diabetes mellitus without complication, without long-term current use of insulin (HCC) -     POCT HgB A1C -     Dulaglutide  (TRULICITY ) 3 MG/0.5ML SOAJ; Inject 3 mg into the skin once a week.  Essential hypertension, benign -     losartan  (COZAAR ) 100 MG tablet; Take 1 tablet (100 mg total) by mouth daily.  Class 3 severe obesity due to excess calories with serious comorbidity and body mass index (BMI) of 40.0 to 44.9 in adult -     phentermine  15 MG capsule; Take 1 capsule (15 mg total) by mouth every morning.  Hypothyroidism, unspecified type -     levothyroxine  (SYNTHROID ) 150 MCG tablet; Take 1 tablet (150  mcg total) by mouth daily.  Dyslipidemia (high LDL; low HDL)  Primary osteoarthritis of both knees   A1C to goal Continue trulicity  and metformin  Discussed nutrition  BP not to goal, on ARB Start checking at home to make sure staying under 130/80 On statin Foot and eye exam UTD Weight is stable-refilled phentermine  and topamax  Pt is on the long term dose of phentermine  Synthroid  refilled today Celebrex  refilled today Encouraged patient to schedule mammogram Pt declined covid booster Encouraged to get flu shot int he fall Will get labs next visit   Return in about 3 months (around 12/24/2023).    Kataleah Bejar, PA-C

## 2023-09-30 MED ORDER — LEVOTHYROXINE SODIUM 150 MCG PO TABS
150.0000 ug | ORAL_TABLET | Freq: Every day | ORAL | 1 refills | Status: AC
Start: 2023-09-30 — End: ?

## 2023-09-30 MED ORDER — TRULICITY 3 MG/0.5ML ~~LOC~~ SOAJ: 3.0000 mg | SUBCUTANEOUS | 0 refills | Status: DC

## 2023-09-30 MED ORDER — PHENTERMINE HCL 15 MG PO CAPS: 15.0000 mg | ORAL_CAPSULE | Freq: Every morning | ORAL | 0 refills | Status: AC

## 2023-09-30 MED ORDER — LOSARTAN POTASSIUM 100 MG PO TABS: 100.0000 mg | ORAL_TABLET | Freq: Every day | ORAL | 1 refills | Status: AC

## 2023-10-02 ENCOUNTER — Other Ambulatory Visit: Payer: Self-pay | Admitting: Physician Assistant

## 2023-10-02 DIAGNOSIS — M170 Bilateral primary osteoarthritis of knee: Secondary | ICD-10-CM

## 2023-10-02 DIAGNOSIS — J301 Allergic rhinitis due to pollen: Secondary | ICD-10-CM

## 2023-10-08 ENCOUNTER — Encounter: Payer: Self-pay | Admitting: Sports Medicine

## 2023-12-23 ENCOUNTER — Encounter: Payer: Self-pay | Admitting: Physician Assistant

## 2023-12-23 ENCOUNTER — Ambulatory Visit: Admitting: Physician Assistant

## 2023-12-23 VITALS — BP 140/90 | HR 80 | Ht 67.0 in | Wt 274.0 lb

## 2023-12-23 DIAGNOSIS — I10 Essential (primary) hypertension: Secondary | ICD-10-CM | POA: Diagnosis not present

## 2023-12-23 DIAGNOSIS — E1169 Type 2 diabetes mellitus with other specified complication: Secondary | ICD-10-CM | POA: Diagnosis not present

## 2023-12-23 DIAGNOSIS — E66813 Obesity, class 3: Secondary | ICD-10-CM

## 2023-12-23 DIAGNOSIS — E119 Type 2 diabetes mellitus without complications: Secondary | ICD-10-CM

## 2023-12-23 DIAGNOSIS — Z7985 Long-term (current) use of injectable non-insulin antidiabetic drugs: Secondary | ICD-10-CM

## 2023-12-23 DIAGNOSIS — E039 Hypothyroidism, unspecified: Secondary | ICD-10-CM | POA: Diagnosis not present

## 2023-12-23 DIAGNOSIS — E785 Hyperlipidemia, unspecified: Secondary | ICD-10-CM | POA: Diagnosis not present

## 2023-12-23 DIAGNOSIS — Z7984 Long term (current) use of oral hypoglycemic drugs: Secondary | ICD-10-CM

## 2023-12-23 DIAGNOSIS — M2550 Pain in unspecified joint: Secondary | ICD-10-CM

## 2023-12-23 DIAGNOSIS — Z6841 Body Mass Index (BMI) 40.0 and over, adult: Secondary | ICD-10-CM

## 2023-12-23 DIAGNOSIS — Z1231 Encounter for screening mammogram for malignant neoplasm of breast: Secondary | ICD-10-CM

## 2023-12-23 LAB — POCT GLYCOSYLATED HEMOGLOBIN (HGB A1C): Hemoglobin A1C: 6.3 % — AB (ref 4.0–5.6)

## 2023-12-23 MED ORDER — TRAMADOL HCL 50 MG PO TABS: 50.0000 mg | ORAL_TABLET | Freq: Four times a day (QID) | ORAL | 0 refills | Status: AC | PRN

## 2023-12-23 MED ORDER — TRULICITY 3 MG/0.5ML ~~LOC~~ SOAJ: 3.0000 mg | SUBCUTANEOUS | 0 refills | Status: AC

## 2023-12-24 ENCOUNTER — Ambulatory Visit: Payer: Self-pay | Admitting: Physician Assistant

## 2023-12-24 DIAGNOSIS — M2550 Pain in unspecified joint: Secondary | ICD-10-CM | POA: Insufficient documentation

## 2023-12-24 NOTE — Progress Notes (Signed)
 Teriah,   Kidney, liver, glucose look good.  Inflammatory labs so far are in normal range.  Some labs still pending.  WBC elevated usually meaning fighting viral or bacterial infection. Maybe you had covid or flu causing ongoing body aches and why WBC elevated. Recheck in 1 week to make sure improving.

## 2023-12-24 NOTE — Progress Notes (Signed)
 Established Patient Office Visit  Subjective   Patient ID: Tina Boone, female    DOB: [birth year]  Age: 48 y.o. MRN: [id number]  Chief Complaint  Patient presents with   Medical Management of Chronic Issues    HPI .SABRADiscussed the use of AI scribe software for clinical note transcription with the patient, who gave verbal consent to proceed.  History of Present Illness Tina Boone is a 47 year old female who presents with worsening joint pain and sleep disturbances. She needs refills of medication.   Polyarticular joint pain - Worsening pain in hips, shoulders, knees, feet, and right hand - Pain alternates between sides, especially in hips, shoulders, and knees - Morning stiffness and pain, exacerbated by cold or rainy weather - Right hand pain impairs grip strength - Occasional swelling in toes, making it difficult to wear shoes - X-ray of foot revealed arthritis - Last knee injection was ineffective, with persistent pain - Considering new orthopedic specialist due to previous provider's unavailability  Sleep disturbance secondary to pain - Significant sleep disruption due to joint pain - Frequent position changes at night to alleviate discomfort - Able to sleep only a few hours at a time  Pain management - Currently taking Celebrex  twice daily (morning and evening) - Supplementing with ibuprofen once midday for additional pain control - Continues to take glucosamine chondroitin supplement  Occupational and psychosocial stress - Very physical job - Increased work-related stress, perceived as contributing to symptom severity  T2DM - Recent A1c measured at 6.3 - not checking sugars - Taking trulicity  and metformin  - denies any hypoglycemic events - not able to exercise like she should - not eating like she should    ROS See HPI.    Objective:     BP (!) 140/90   Pulse 80   Ht 5' 7 (1.702 m)   Wt 274 lb (124.3 kg)   SpO2 99%   BMI  42.91 kg/m  BP Readings from Last 3 Encounters:  12/23/23 (!) 140/90  09/23/23 138/78  06/24/23 135/80   Wt Readings from Last 3 Encounters:  12/23/23 274 lb (124.3 kg)  09/23/23 265 lb (120.2 kg)  06/24/23 266 lb (120.7 kg)    .SABRA Lab Results  Component Value Date   HGBA1C 6.3 (A) 12/23/2023     Physical Exam Constitutional:      Appearance: Normal appearance. She is obese.  HENT:     Head: Normocephalic.  Cardiovascular:     Rate and Rhythm: Normal rate.  Pulmonary:     Effort: Pulmonary effort is normal.     Breath sounds: Normal breath sounds.  Musculoskeletal:     Right lower leg: No edema.     Left lower leg: No edema.     Comments: No swelling or redness of hands.   Neurological:     General: No focal deficit present.     Mental Status: She is alert and oriented to person, place, and time.  Psychiatric:        Mood and Affect: Mood normal.         The 10-year ASCVD risk score (Arnett DK, et al., 2019) is: 3.1%    Assessment & Plan:  SABRASABRAJulissa was seen today for medical management of chronic issues.  Diagnoses and all orders for this visit:  Hyperlipidemia associated with type 2 diabetes mellitus (HCC) -     POCT HgB A1C -     POCT UA - Microalbumin -  Dulaglutide  (TRULICITY ) 3 MG/0.5ML SOAJ; Inject 3 mg into the skin once a week.  Essential hypertension, benign  Class 3 severe obesity due to excess calories with serious comorbidity and body mass index (BMI) of 40.0 to 44.9 in adult [facility])  Hypothyroidism, unspecified type  Polyarthralgia -     Sed Rate (ESR) -     CMP14+EGFR -     ANA,IFA RA Diag Pnl w/rflx Tit/Patn -     CYCLIC CITRUL PEPTIDE ANTIBODY, IGG/IGA -     C-reactive protein -     CBC w/Diff/Platelet -     traMADol (ULTRAM) 50 MG tablet; Take 1 tablet (50 mg total) by mouth every 6 (six) hours as needed for up to 5 days.  Visit for screening mammogram -     MM 3D SCREENING MAMMOGRAM BILATERAL BREAST  Morbid obesity  (HCC)   Assessment & Plan T2DM associated with hyperlipidemia A1C to goal today - continue on Trulicity  and metformin  - continue to work on diabetic diet and staying active - continue on  topamax , hold phentermine  while BP elevated - BP not to goal but she reports pain today. Keep home BP log and follow up in 4 weeks. - on statin - eye and foot exam UTD - Flu and pneumonia vaccine declined  Generalized osteoarthritis involving multiple joints Chronic pain in multiple joints, worsened by activity and weather. Current NSAIDs insufficient. Considered rheumatoid arthritis due to family history and joint involvement. - Prescribed tramadol for breakthrough pain and to aid sleep. - Ordered blood tests to evaluate for rheumatoid arthritis. - Will consider referral to orthopedic or rheumatologist as needed or dictated by labs   Essential hypertension Blood pressure elevated, possibly due to pain. - Rechecked blood pressure.  Hypothyroidism - continue on levothyroxine  daily    Return in about 4 weeks (around 01/20/2024) for joint pain.    Lorrin Nawrot, PA-C

## 2023-12-25 LAB — CBC WITH DIFFERENTIAL/PLATELET
Basophils Absolute: 0.1 x10E3/uL (ref 0.0–0.2)
Basos: 1 %
EOS (ABSOLUTE): 0.3 x10E3/uL (ref 0.0–0.4)
Eos: 2 %
Hematocrit: 45.9 % (ref 34.0–46.6)
Hemoglobin: 15.1 g/dL (ref 11.1–15.9)
Immature Grans (Abs): 0.1 x10E3/uL (ref 0.0–0.1)
Immature Granulocytes: 1 %
Lymphocytes Absolute: 3.3 x10E3/uL — ABNORMAL HIGH (ref 0.7–3.1)
Lymphs: 26 %
MCH: 29.7 pg (ref 26.6–33.0)
MCHC: 32.9 g/dL (ref 31.5–35.7)
MCV: 90 fL (ref 79–97)
Monocytes Absolute: 0.8 x10E3/uL (ref 0.1–0.9)
Monocytes: 6 %
Neutrophils Absolute: 8.3 x10E3/uL — ABNORMAL HIGH (ref 1.4–7.0)
Neutrophils: 64 %
Platelets: 369 x10E3/uL (ref 150–450)
RBC: 5.08 x10E6/uL (ref 3.77–5.28)
RDW: 12.9 % (ref 11.7–15.4)
WBC: 12.8 x10E3/uL — ABNORMAL HIGH (ref 3.4–10.8)

## 2023-12-25 LAB — CMP14+EGFR
ALT: 22 IU/L (ref 0–32)
AST: 26 IU/L (ref 0–40)
Albumin: 4.2 g/dL (ref 3.9–4.9)
Alkaline Phosphatase: 72 IU/L (ref 41–116)
BUN/Creatinine Ratio: 23 (ref 9–23)
BUN: 17 mg/dL (ref 6–24)
Bilirubin Total: 0.5 mg/dL (ref 0.0–1.2)
CO2: 18 mmol/L — ABNORMAL LOW (ref 20–29)
Calcium: 9.6 mg/dL (ref 8.7–10.2)
Chloride: 105 mmol/L (ref 96–106)
Creatinine, Ser: 0.75 mg/dL (ref 0.57–1.00)
Globulin, Total: 2.7 g/dL (ref 1.5–4.5)
Glucose: 94 mg/dL (ref 70–99)
Potassium: 4.6 mmol/L (ref 3.5–5.2)
Sodium: 139 mmol/L (ref 134–144)
Total Protein: 6.9 g/dL (ref 6.0–8.5)
eGFR: 98 mL/min/1.73 (ref >59)

## 2023-12-25 LAB — ANA,IFA RA DIAG PNL W/RFLX TIT/PATN
Cyclic Citrullin Peptide Ab: 9 U (ref 0–19)
Rheumatoid fact SerPl-aCnc: <10 [IU]/mL (ref <14.0)

## 2023-12-25 LAB — C-REACTIVE PROTEIN: CRP: 5 mg/L (ref 0–10)

## 2023-12-25 LAB — SEDIMENTATION RATE: Sed Rate: 22 mm/h (ref 0–32)

## 2023-12-25 NOTE — Progress Notes (Signed)
 No signs of autoimmune inflammation/arthritis.

## 2024-01-16 ENCOUNTER — Other Ambulatory Visit: Payer: Self-pay | Admitting: Physician Assistant

## 2024-01-16 DIAGNOSIS — E785 Hyperlipidemia, unspecified: Secondary | ICD-10-CM

## 2024-01-16 DIAGNOSIS — Z3009 Encounter for other general counseling and advice on contraception: Secondary | ICD-10-CM

## 2024-01-16 DIAGNOSIS — I10 Essential (primary) hypertension: Secondary | ICD-10-CM

## 2024-01-20 ENCOUNTER — Ambulatory Visit: Admitting: Physician Assistant

## 2024-01-20 ENCOUNTER — Encounter: Payer: Self-pay | Admitting: Physician Assistant

## 2024-01-20 VITALS — BP 128/82 | HR 99 | Ht 67.0 in | Wt 272.0 lb

## 2024-01-20 DIAGNOSIS — M170 Bilateral primary osteoarthritis of knee: Secondary | ICD-10-CM

## 2024-01-20 DIAGNOSIS — E66813 Obesity, class 3: Secondary | ICD-10-CM

## 2024-01-20 DIAGNOSIS — M79641 Pain in right hand: Secondary | ICD-10-CM | POA: Insufficient documentation

## 2024-01-20 DIAGNOSIS — E785 Hyperlipidemia, unspecified: Secondary | ICD-10-CM

## 2024-01-20 DIAGNOSIS — M79642 Pain in left hand: Secondary | ICD-10-CM

## 2024-01-20 DIAGNOSIS — I10 Essential (primary) hypertension: Secondary | ICD-10-CM

## 2024-01-20 DIAGNOSIS — M25551 Pain in right hip: Secondary | ICD-10-CM | POA: Insufficient documentation

## 2024-01-20 DIAGNOSIS — M2550 Pain in unspecified joint: Secondary | ICD-10-CM

## 2024-01-20 MED ORDER — TRAMADOL HCL 50 MG PO TABS: 50.0000 mg | ORAL_TABLET | Freq: Two times a day (BID) | ORAL | 0 refills | Status: AC | PRN

## 2024-01-20 MED ORDER — KETOROLAC TROMETHAMINE 60 MG/2ML IM SOLN: 30.0000 mg | Freq: Once | INTRAMUSCULAR | Status: AC

## 2024-01-20 NOTE — Progress Notes (Unsigned)
° °  Established Patient Office Visit  Subjective   Patient ID: Neesha Langton, female    DOB: 06-23-1975  Age: 48 y.o. MRN: [id number]  Chief Complaint  Patient presents with   Medical Management of Chronic Issues    HPI .SABRADiscussed the use of AI scribe software for clinical note transcription with the patient, who gave verbal consent to proceed.  History of Present Illness Carnelia Oscar is a 48 year old female who presents with all over body joint pain.  Polyarticular joint pain - Persistent pain affecting knees, back, hips, feet, and hands, described as 'all over' - Current pain severity rated at 5 out of 10 - Knee and hip pain are particularly bothersome and disrupt sleep - Celebrex  taken twice daily and Tylenol  as needed for pain management - Tramadol  taken on nights when not working the next day due to concern for drowsiness; provides some relief - Previous steroid injections in both knees were initially effective, but most recent injection provided minimal relief  Previous rheumatologic evaluation - Laboratory workup negative for rheumatoid factor - Normal sedimentation rate, C-reactive protein, and antinuclear antibody  Occupational stress - Very physical job with increased responsibilities and insufficient time to complete tasks - Work environment described as stressful, contributing to significant stress  Blood pressure monitoring - Home blood pressure readings typically within normal limits at rest, around 120/80 mmHg - Elevated blood pressure noted in clinic following a very stressful morning    ROS    Objective:     BP (!) 147/109   Pulse 99   Ht 5' 7 (1.702 m)   Wt 272 lb (123.4 kg)   SpO2 99%   BMI 42.60 kg/m  {Vitals History (Optional):23777}  Physical Exam   No results found for any visits on 01/20/24.  {Labs (Optional):23779}  The 10-year ASCVD risk score (Arnett DK, et al., 2019) is: 3.4%    Assessment & Plan:   .SABRASABRALealer was seen today for medical management of chronic issues.  Diagnoses and all orders for this visit:  Essential hypertension, benign  Morbid obesity (HCC)  Primary osteoarthritis of both knees  Right hip pain -     ketorolac  (TORADOL ) injection 30 mg  Bilateral hand pain  Other orders -     traMADol  (ULTRAM ) 50 MG tablet; Take 1 tablet (50 mg total) by mouth every 12 (twelve) hours as needed. -     traMADol  (ULTRAM ) 50 MG tablet; Take 1 tablet (50 mg total) by mouth every 12 (twelve) hours as needed.    Return in about 2 months (around 03/22/2024) for for diabetes follow up.    Tytionna Cloyd, PA-C

## 2024-01-20 NOTE — Patient Instructions (Signed)
 Will make referral to sports medicine.  Increase tramadol  to twice a day.  Continue on celebrex  twice a day.

## 2024-01-21 ENCOUNTER — Encounter: Payer: Self-pay | Admitting: Physician Assistant

## 2024-01-23 ENCOUNTER — Ambulatory Visit (HOSPITAL_BASED_OUTPATIENT_CLINIC_OR_DEPARTMENT_OTHER): Admission: RE | Admit: 2024-01-23 | Discharge: 2024-01-23 | Disposition: A | Source: Ambulatory Visit

## 2024-01-23 ENCOUNTER — Other Ambulatory Visit: Payer: Self-pay

## 2024-01-23 ENCOUNTER — Ambulatory Visit: Payer: Self-pay

## 2024-01-23 ENCOUNTER — Ambulatory Visit

## 2024-01-23 VITALS — BP 142/92 | Ht 67.0 in | Wt 272.0 lb

## 2024-01-23 DIAGNOSIS — M25552 Pain in left hip: Secondary | ICD-10-CM

## 2024-01-23 DIAGNOSIS — M1711 Unilateral primary osteoarthritis, right knee: Secondary | ICD-10-CM

## 2024-01-23 DIAGNOSIS — M170 Bilateral primary osteoarthritis of knee: Secondary | ICD-10-CM

## 2024-01-23 DIAGNOSIS — M1712 Unilateral primary osteoarthritis, left knee: Secondary | ICD-10-CM

## 2024-01-23 DIAGNOSIS — M25551 Pain in right hip: Secondary | ICD-10-CM

## 2024-01-23 MED ORDER — METHYLPREDNISOLONE ACETATE 40 MG/ML IJ SUSP
40.0000 mg | Freq: Once | INTRAMUSCULAR | Status: AC
  Administered 2024-01-23: 16:00:00 40 mg via INTRA_ARTICULAR

## 2024-01-23 NOTE — Progress Notes (Unsigned)
° °  Subjective:    Patient ID: Dejha King, female    DOB: 48 y.o., 01/25/1976   MRN: [id number]  Chief Complaint: multiple joint pains  Discussed the use of AI scribe software for clinical note transcription with the patient, who gave verbal consent to proceed.  History of Present Illness Wanda is a 48 year old female With past medical history significant for hypertension, polyarthralgia presenting with polyarticular pain.  Polyarticular joint pain - Persistent pain affecting knees, back, hips, feet, and hands, described as 'all over' - Current pain severity rated at 5 out of 10 - Knee and hip pain are particularly bothersome and disrupt sleep - Celebrex  taken twice daily and Tylenol  as needed for pain management - Tramadol  taken on nights when not working the next day due to concern for drowsiness; provides some relief. She has not noticed it makes her sleepy at all.  - Previous steroid injections in both knees were initially effective, but most recent injection provided minimal relief   Review of Pertinent Imaging: ***    Objective:   Vitals:   01/23/24 1554  BP: (!) 142/92    ***     Assessment & Plan:   Assessment & Plan

## 2024-01-25 ENCOUNTER — Ambulatory Visit: Payer: Self-pay

## 2024-01-25 ENCOUNTER — Other Ambulatory Visit: Payer: Self-pay | Admitting: Physician Assistant

## 2024-01-25 DIAGNOSIS — Z3009 Encounter for other general counseling and advice on contraception: Secondary | ICD-10-CM

## 2024-01-26 ENCOUNTER — Ambulatory Visit (HOSPITAL_BASED_OUTPATIENT_CLINIC_OR_DEPARTMENT_OTHER)
Admission: RE | Admit: 2024-01-26 | Discharge: 2024-01-26 | Disposition: A | Discharge status: Home / Self Care | Source: Ambulatory Visit | Attending: Physician Assistant | Admitting: Physician Assistant

## 2024-01-26 DIAGNOSIS — Z1231 Encounter for screening mammogram for malignant neoplasm of breast: Secondary | ICD-10-CM | POA: Insufficient documentation

## 2024-01-31 ENCOUNTER — Other Ambulatory Visit: Payer: Self-pay | Admitting: Physician Assistant

## 2024-01-31 DIAGNOSIS — R928 Other abnormal and inconclusive findings on diagnostic imaging of breast: Secondary | ICD-10-CM

## 2024-02-03 NOTE — Progress Notes (Signed)
 Mammogram showed some left breat asymmetry. Imaging will contact you about more scans to evaluate.

## 2024-03-06 ENCOUNTER — Other Ambulatory Visit: Payer: Self-pay | Admitting: Physician Assistant

## 2024-03-06 NOTE — Telephone Encounter (Signed)
 Pt is supposed to be taking 150mcg and that was sent with refills in August.

## 2024-03-23 ENCOUNTER — Ambulatory Visit: Admitting: Physician Assistant
